# Patient Record
Sex: Female | Born: 1985 | Race: Black or African American | Hispanic: No | Marital: Single | Smoking: Never smoker
Health system: Southern US, Community
[De-identification: ages and names within clinical notes are randomized; demographics above are authoritative.]

## PROBLEM LIST (undated history)

## (undated) ENCOUNTER — Inpatient Hospital Stay: Payer: Self-pay

## (undated) DIAGNOSIS — E119 Type 2 diabetes mellitus without complications: Secondary | ICD-10-CM

## (undated) DIAGNOSIS — D649 Anemia, unspecified: Secondary | ICD-10-CM

---

## 2013-11-07 ENCOUNTER — Ambulatory Visit: Payer: Self-pay | Admitting: Family Medicine

## 2013-11-07 LAB — COMPREHENSIVE METABOLIC PANEL
ALT: 18 U/L
AST: 13 U/L — AB (ref 15–37)
Albumin: 3.5 g/dL (ref 3.4–5.0)
Alkaline Phosphatase: 55 U/L
Anion Gap: 8 (ref 7–16)
BUN: 7 mg/dL (ref 7–18)
Bilirubin,Total: 0.2 mg/dL (ref 0.2–1.0)
CALCIUM: 9.5 mg/dL (ref 8.5–10.1)
CHLORIDE: 103 mmol/L (ref 98–107)
CO2: 28 mmol/L (ref 21–32)
CREATININE: 0.97 mg/dL (ref 0.60–1.30)
EGFR (African American): >60
GLUCOSE: 99 mg/dL (ref 65–99)
OSMOLALITY: 276 (ref 275–301)
POTASSIUM: 3.9 mmol/L (ref 3.5–5.1)
Sodium: 139 mmol/L (ref 136–145)
Total Protein: 7.4 g/dL (ref 6.4–8.2)

## 2013-11-07 LAB — URINALYSIS, COMPLETE
BILIRUBIN, UR: NEGATIVE
Glucose,UR: NEGATIVE
Ketone: NEGATIVE
Nitrite: NEGATIVE
PH: 6 (ref 5.0–8.0)
Protein: NEGATIVE
SPECIFIC GRAVITY: 1.025 (ref 1.000–1.030)

## 2013-11-07 LAB — PREGNANCY, URINE: Pregnancy Test, Urine: POSITIVE m[IU]/mL

## 2013-11-11 LAB — URINE CULTURE

## 2013-11-30 ENCOUNTER — Ambulatory Visit: Payer: Self-pay

## 2013-11-30 LAB — URINALYSIS, COMPLETE
BILIRUBIN, UR: NEGATIVE
Blood: NEGATIVE
GLUCOSE, UR: NEGATIVE
Leukocyte Esterase: NEGATIVE
NITRITE: NEGATIVE
Ph: 5.5 (ref 5.0–8.0)
Specific Gravity: 1.02 (ref 1.000–1.030)

## 2013-12-02 LAB — URINE CULTURE

## 2014-05-01 ENCOUNTER — Ambulatory Visit
Admit: 2014-05-01 | Disposition: A | Discharge status: Home / Self Care | Payer: Self-pay | Attending: Nurse Practitioner | Admitting: Nurse Practitioner

## 2014-05-12 DIAGNOSIS — O24419 Gestational diabetes mellitus in pregnancy, unspecified control: Secondary | ICD-10-CM | POA: Insufficient documentation

## 2014-05-12 DIAGNOSIS — N12 Tubulo-interstitial nephritis, not specified as acute or chronic: Secondary | ICD-10-CM | POA: Insufficient documentation

## 2014-05-18 ENCOUNTER — Ambulatory Visit
Admit: 2014-05-18 | Disposition: A | Discharge status: Home / Self Care | Payer: Self-pay | Attending: Nurse Practitioner | Admitting: Nurse Practitioner

## 2014-06-03 ENCOUNTER — Observation Stay
Admit: 2014-06-03 | Disposition: A | Discharge status: Home / Self Care | Payer: Self-pay | Attending: Certified Nurse Midwife | Admitting: Certified Nurse Midwife

## 2014-06-03 LAB — URINALYSIS, COMPLETE
BILIRUBIN, UR: NEGATIVE
Bacteria: NONE SEEN
Blood: NEGATIVE
Glucose,UR: NEGATIVE mg/dL (ref 0–75)
KETONE: NEGATIVE
Leukocyte Esterase: NEGATIVE
Nitrite: NEGATIVE
Ph: 6 (ref 4.5–8.0)
Specific Gravity: 1.024 (ref 1.003–1.030)

## 2014-06-06 ENCOUNTER — Ambulatory Visit
Admit: 2014-06-06 | Disposition: A | Discharge status: Home / Self Care | Payer: Self-pay | Attending: Advanced Practice Midwife | Admitting: Advanced Practice Midwife

## 2014-06-26 ENCOUNTER — Encounter
Admission: EM | Disposition: A | Discharge status: Home / Self Care | Payer: Self-pay | Source: Ambulatory Visit | Attending: Obstetrics and Gynecology

## 2014-06-26 ENCOUNTER — Inpatient Hospital Stay: Payer: Medicaid Other | Admitting: Anesthesiology

## 2014-06-26 ENCOUNTER — Inpatient Hospital Stay
Admission: EM | Admit: 2014-06-26 | Discharge: 2014-06-28 | DRG: 765 | Disposition: A | Discharge status: Home / Self Care | Payer: Medicaid Other | Source: Ambulatory Visit | Attending: Obstetrics and Gynecology | Admitting: Obstetrics and Gynecology

## 2014-06-26 ENCOUNTER — Encounter: Payer: Self-pay | Admitting: *Deleted

## 2014-06-26 DIAGNOSIS — O24419 Gestational diabetes mellitus in pregnancy, unspecified control: Secondary | ICD-10-CM | POA: Diagnosis present

## 2014-06-26 DIAGNOSIS — O328XX0 Maternal care for other malpresentation of fetus, not applicable or unspecified: Principal | ICD-10-CM | POA: Diagnosis present

## 2014-06-26 DIAGNOSIS — D62 Acute posthemorrhagic anemia: Secondary | ICD-10-CM | POA: Diagnosis not present

## 2014-06-26 DIAGNOSIS — R234 Changes in skin texture: Secondary | ICD-10-CM

## 2014-06-26 DIAGNOSIS — O329XX0 Maternal care for malpresentation of fetus, unspecified, not applicable or unspecified: Secondary | ICD-10-CM | POA: Diagnosis present

## 2014-06-26 DIAGNOSIS — Z6835 Body mass index (BMI) 35.0-35.9, adult: Secondary | ICD-10-CM | POA: Diagnosis not present

## 2014-06-26 DIAGNOSIS — Z3A39 39 weeks gestation of pregnancy: Secondary | ICD-10-CM | POA: Diagnosis present

## 2014-06-26 DIAGNOSIS — O321XX0 Maternal care for breech presentation, not applicable or unspecified: Secondary | ICD-10-CM | POA: Diagnosis present

## 2014-06-26 HISTORY — DX: Type 2 diabetes mellitus without complications: E11.9

## 2014-06-26 HISTORY — DX: Anemia, unspecified: D64.9

## 2014-06-26 LAB — OB RESULTS CONSOLE ABO/RH: RH Type: POSITIVE

## 2014-06-26 LAB — TYPE AND SCREEN
ABO/RH(D): A POS
ANTIBODY SCREEN: NEGATIVE

## 2014-06-26 LAB — OB RESULTS CONSOLE HIV ANTIBODY (ROUTINE TESTING): HIV: NONREACTIVE

## 2014-06-26 LAB — HEMOGLOBIN AND HEMATOCRIT, BLOOD
HEMATOCRIT: 30.2 % — AB (ref 35.0–47.0)
Hemoglobin: 10 g/dL — ABNORMAL LOW (ref 12.0–16.0)

## 2014-06-26 LAB — ABO/RH: ABO/RH(D): A POS

## 2014-06-26 LAB — CBC
HEMATOCRIT: 32.2 % — AB (ref 35.0–47.0)
Hemoglobin: 10.3 g/dL — ABNORMAL LOW (ref 12.0–16.0)
MCH: 29 pg (ref 26.0–34.0)
MCHC: 32.1 g/dL (ref 32.0–36.0)
MCV: 90.2 fL (ref 80.0–100.0)
Platelets: 270 10*3/uL (ref 150–440)
RBC: 3.56 MIL/uL — AB (ref 3.80–5.20)
RDW: 15.1 % — ABNORMAL HIGH (ref 11.5–14.5)
WBC: 5.8 10*3/uL (ref 3.6–11.0)

## 2014-06-26 LAB — OB RESULTS CONSOLE ANTIBODY SCREEN: ANTIBODY SCREEN: NEGATIVE

## 2014-06-26 LAB — OB RESULTS CONSOLE GC/CHLAMYDIA
CHLAMYDIA, DNA PROBE: NEGATIVE
GC PROBE AMP, GENITAL: NEGATIVE

## 2014-06-26 LAB — OB RESULTS CONSOLE RUBELLA ANTIBODY, IGM: Rubella: IMMUNE

## 2014-06-26 LAB — OB RESULTS CONSOLE VARICELLA ZOSTER ANTIBODY, IGG: Varicella: IMMUNE

## 2014-06-26 LAB — OB RESULTS CONSOLE RPR: RPR: NONREACTIVE

## 2014-06-26 LAB — OB RESULTS CONSOLE GBS: STREP GROUP B AG: NEGATIVE

## 2014-06-26 LAB — OB RESULTS CONSOLE HEPATITIS B SURFACE ANTIGEN: HEP B S AG: NEGATIVE

## 2014-06-26 LAB — GLUCOSE, CAPILLARY: Glucose-Capillary: 98 mg/dL (ref 70–99)

## 2014-06-26 SURGERY — Surgical Case
Anesthesia: Spinal

## 2014-06-26 MED ORDER — SENNOSIDES-DOCUSATE SODIUM 8.6-50 MG PO TABS: 2.0000 | ORAL_TABLET | Freq: Every evening | ORAL | Status: DC | PRN

## 2014-06-26 MED ORDER — SIMETHICONE 80 MG PO CHEW
80.0000 mg | CHEWABLE_TABLET | ORAL | Status: DC
  Administered 2014-06-26 – 2014-06-28 (×3): 80 mg via ORAL
  Filled 2014-06-26 (×2): qty 1

## 2014-06-26 MED ORDER — CEFAZOLIN SODIUM-DEXTROSE 2-3 GM-% IV SOLR
2.0000 g | INTRAVENOUS | Status: AC
  Administered 2014-06-26: 2 g via INTRAVENOUS

## 2014-06-26 MED ORDER — LACTATED RINGERS IV SOLN
INTRAVENOUS | Status: DC
  Administered 2014-06-26: 09:00:00 via INTRAVENOUS

## 2014-06-26 MED ORDER — NALOXONE HCL 1 MG/ML IJ SOLN
1.0000 ug/kg/h | INTRAVENOUS | Status: DC | PRN
  Filled 2014-06-26: qty 2

## 2014-06-26 MED ORDER — HYDROMORPHONE HCL 1 MG/ML IJ SOLN: 0.2500 mg | INTRAMUSCULAR | Status: DC | PRN

## 2014-06-26 MED ORDER — SODIUM CHLORIDE 0.9 % IJ SOLN
INTRAMUSCULAR | Status: AC
  Filled 2014-06-26: qty 6

## 2014-06-26 MED ORDER — FENTANYL CITRATE (PF) 250 MCG/5ML IJ SOLN
INTRAMUSCULAR | Status: DC | PRN
  Administered 2014-06-26: 20 ug via INTRATHECAL

## 2014-06-26 MED ORDER — MEPERIDINE HCL 25 MG/ML IJ SOLN: 6.2500 mg | INTRAMUSCULAR | Status: DC | PRN

## 2014-06-26 MED ORDER — FENTANYL CITRATE (PF) 100 MCG/2ML IJ SOLN: 25.0000 ug | INTRAMUSCULAR | Status: DC | PRN

## 2014-06-26 MED ORDER — NALBUPHINE HCL 10 MG/ML IJ SOLN: 5.0000 mg | INTRAMUSCULAR | Status: DC | PRN

## 2014-06-26 MED ORDER — PHENYLEPHRINE HCL 10 MG/ML IJ SOLN
INTRAMUSCULAR | Status: DC | PRN
  Administered 2014-06-26 (×6): 100 ug via INTRAVENOUS

## 2014-06-26 MED ORDER — MENTHOL 3 MG MT LOZG
1.0000 | LOZENGE | OROMUCOSAL | Status: DC | PRN
  Filled 2014-06-26: qty 9

## 2014-06-26 MED ORDER — MORPHINE SULFATE (PF) 0.5 MG/ML IJ SOLN
INTRAMUSCULAR | Status: DC | PRN
  Administered 2014-06-26: .2 mg via INTRATHECAL

## 2014-06-26 MED ORDER — DIPHENHYDRAMINE HCL 25 MG PO CAPS: 25.0000 mg | ORAL_CAPSULE | Freq: Four times a day (QID) | ORAL | Status: DC | PRN

## 2014-06-26 MED ORDER — DIPHENHYDRAMINE HCL 50 MG/ML IJ SOLN: 12.5000 mg | INTRAMUSCULAR | Status: DC | PRN

## 2014-06-26 MED ORDER — SIMETHICONE 80 MG PO CHEW
80.0000 mg | CHEWABLE_TABLET | Freq: Three times a day (TID) | ORAL | Status: DC
  Administered 2014-06-27 – 2014-06-28 (×5): 80 mg via ORAL
  Filled 2014-06-26 (×4): qty 1

## 2014-06-26 MED ORDER — ONDANSETRON HCL 4 MG/2ML IJ SOLN
INTRAMUSCULAR | Status: DC | PRN
  Administered 2014-06-26: 4 mg via INTRAVENOUS

## 2014-06-26 MED ORDER — SODIUM CHLORIDE 0.9 % IJ SOLN: 3.0000 mL | INTRAMUSCULAR | Status: DC | PRN

## 2014-06-26 MED ORDER — SCOPOLAMINE 1 MG/3DAYS TD PT72
1.0000 | MEDICATED_PATCH | Freq: Once | TRANSDERMAL | Status: DC
  Filled 2014-06-26: qty 1

## 2014-06-26 MED ORDER — ONDANSETRON HCL 4 MG/2ML IJ SOLN
4.0000 mg | Freq: Three times a day (TID) | INTRAMUSCULAR | Status: DC | PRN
  Administered 2014-06-26: 4 mg via INTRAVENOUS
  Filled 2014-06-26: qty 2

## 2014-06-26 MED ORDER — OXYCODONE HCL 5 MG PO TABS
10.0000 mg | ORAL_TABLET | Freq: Four times a day (QID) | ORAL | Status: DC | PRN
  Administered 2014-06-26 – 2014-06-28 (×7): 10 mg via ORAL
  Filled 2014-06-26 (×8): qty 2

## 2014-06-26 MED ORDER — LANOLIN HYDROUS EX OINT: 1.0000 "application " | TOPICAL_OINTMENT | CUTANEOUS | Status: DC | PRN

## 2014-06-26 MED ORDER — LACTATED RINGERS IV BOLUS (SEPSIS)
1000.0000 mL | Freq: Once | INTRAVENOUS | Status: AC
  Administered 2014-06-26: 1000 mL via INTRAVENOUS

## 2014-06-26 MED ORDER — DIPHENHYDRAMINE HCL 25 MG PO CAPS: 25.0000 mg | ORAL_CAPSULE | ORAL | Status: DC | PRN

## 2014-06-26 MED ORDER — NALBUPHINE HCL 10 MG/ML IJ SOLN: 5.0000 mg | Freq: Once | INTRAMUSCULAR | Status: AC | PRN

## 2014-06-26 MED ORDER — TETANUS-DIPHTH-ACELL PERTUSSIS 5-2.5-18.5 LF-MCG/0.5 IM SUSP
0.5000 mL | Freq: Once | INTRAMUSCULAR | Status: DC
  Filled 2014-06-26: qty 0.5

## 2014-06-26 MED ORDER — WITCH HAZEL-GLYCERIN EX PADS: 1.0000 "application " | MEDICATED_PAD | CUTANEOUS | Status: DC | PRN

## 2014-06-26 MED ORDER — POTASSIUM CITRATE-CITRIC ACID 1100-334 MG/5ML PO SOLN
30.0000 meq | Freq: Once | ORAL | Status: AC
  Administered 2014-06-26: 30 meq via ORAL
  Filled 2014-06-26: qty 15

## 2014-06-26 MED ORDER — LACTATED RINGERS IV SOLN: INTRAVENOUS | Status: DC

## 2014-06-26 MED ORDER — ACETAMINOPHEN 325 MG PO TABS: 650.0000 mg | ORAL_TABLET | ORAL | Status: DC | PRN

## 2014-06-26 MED ORDER — IBUPROFEN 600 MG PO TABS
600.0000 mg | ORAL_TABLET | Freq: Four times a day (QID) | ORAL | Status: DC | PRN
  Administered 2014-06-27 – 2014-06-28 (×4): 600 mg via ORAL
  Filled 2014-06-26 (×4): qty 1

## 2014-06-26 MED ORDER — LACTATED RINGERS IV SOLN
INTRAVENOUS | Status: DC
  Administered 2014-06-27: 09:00:00 via INTRAVENOUS

## 2014-06-26 MED ORDER — OXYTOCIN 40 UNITS IN LACTATED RINGERS INFUSION - SIMPLE MED
INTRAVENOUS | Status: AC
  Administered 2014-06-26: 1000 mL via INTRAVENOUS
  Filled 2014-06-26: qty 1000

## 2014-06-26 MED ORDER — PRENATAL MULTIVITAMIN CH
1.0000 | ORAL_TABLET | Freq: Every day | ORAL | Status: DC
  Administered 2014-06-27: 1 via ORAL
  Filled 2014-06-26: qty 1

## 2014-06-26 MED ORDER — NALOXONE HCL 0.4 MG/ML IJ SOLN: 0.4000 mg | INTRAMUSCULAR | Status: DC | PRN

## 2014-06-26 MED ORDER — OXYTOCIN 40 UNITS IN LACTATED RINGERS INFUSION - SIMPLE MED
62.5000 mL/h | INTRAVENOUS | Status: AC
  Filled 2014-06-26: qty 1000

## 2014-06-26 MED ORDER — SIMETHICONE 80 MG PO CHEW
80.0000 mg | CHEWABLE_TABLET | ORAL | Status: DC | PRN
Start: 2014-06-26 — End: 2014-06-29
  Filled 2014-06-26: qty 1

## 2014-06-26 MED ORDER — ONDANSETRON HCL 4 MG/2ML IJ SOLN: 4.0000 mg | Freq: Once | INTRAMUSCULAR | Status: DC | PRN

## 2014-06-26 MED ORDER — OXYCODONE HCL 5 MG PO TABS
5.0000 mg | ORAL_TABLET | Freq: Four times a day (QID) | ORAL | Status: DC | PRN
  Administered 2014-06-26 – 2014-06-27 (×2): 5 mg via ORAL
  Filled 2014-06-26: qty 1

## 2014-06-26 MED ORDER — SIMETHICONE 80 MG PO CHEW
80.0000 mg | CHEWABLE_TABLET | ORAL | Status: DC
  Filled 2014-06-26: qty 1

## 2014-06-26 MED ORDER — DIBUCAINE 1 % RE OINT: 1.0000 "application " | TOPICAL_OINTMENT | RECTAL | Status: DC | PRN

## 2014-06-26 SURGICAL SUPPLY — 31 items
CANISTER SUCT 3000ML (MISCELLANEOUS) ×3 IMPLANT
CHLORAPREP W/TINT 26ML (MISCELLANEOUS) ×3 IMPLANT
CLOSURE WOUND 1/2 X4 (GAUZE/BANDAGES/DRESSINGS) ×1
DRSG TELFA 3X8 NADH (GAUZE/BANDAGES/DRESSINGS) ×3 IMPLANT
GAUZE SPONGE 4X4 12PLY STRL (GAUZE/BANDAGES/DRESSINGS) ×3 IMPLANT
GLOVE BIO SURGEON STRL SZ7 (GLOVE) ×12 IMPLANT
GLOVE INDICATOR 7.5 STRL GRN (GLOVE) ×12 IMPLANT
GOWN STRL REUS W/ TWL LRG LVL3 (GOWN DISPOSABLE) ×3 IMPLANT
GOWN STRL REUS W/ TWL XL LVL3 (GOWN DISPOSABLE) ×2 IMPLANT
GOWN STRL REUS W/TWL LRG LVL3 (GOWN DISPOSABLE) ×6
GOWN STRL REUS W/TWL XL LVL3 (GOWN DISPOSABLE) ×4
LIQUID BAND (GAUZE/BANDAGES/DRESSINGS) ×3 IMPLANT
NS IRRIG 1000ML POUR BTL (IV SOLUTION) ×3 IMPLANT
PACK C SECTION AR (MISCELLANEOUS) ×3 IMPLANT
PAD GROUND ADULT SPLIT (MISCELLANEOUS) ×3 IMPLANT
PAD OB MATERNITY 4.3X12.25 (PERSONAL CARE ITEMS) ×3 IMPLANT
PAD PREP 24X41 OB/GYN DISP (PERSONAL CARE ITEMS) ×3 IMPLANT
SPONGE LAP 18X18 5 PK (GAUZE/BANDAGES/DRESSINGS) ×6 IMPLANT
STRIP CLOSURE SKIN 1/2X4 (GAUZE/BANDAGES/DRESSINGS) ×2 IMPLANT
SUT MNCRL 3 0 RB1 (SUTURE) ×3 IMPLANT
SUT MNCRL AB 4-0 PS2 18 (SUTURE) ×3 IMPLANT
SUT MONOCRYL 3 0 RB1 (SUTURE) ×6
SUT PDS AB 1 TP1 96 (SUTURE) ×3 IMPLANT
SUT PLAIN 2 0 XLH (SUTURE) ×3 IMPLANT
SUT VIC AB 0 CT1 36 (SUTURE) ×6 IMPLANT
SUT VIC AB 0 CTX 36 (SUTURE) ×4
SUT VIC AB 0 CTX36XBRD ANBCTRL (SUTURE) ×2 IMPLANT
SUT VIC AB 2-0 CT1 36 (SUTURE) ×3 IMPLANT
SUT VIC AB 3-0 SH 27 (SUTURE) ×2
SUT VIC AB 3-0 SH 27X BRD (SUTURE) ×1 IMPLANT
SYRINGE 10CC LL (SYRINGE) ×3 IMPLANT

## 2014-06-26 NOTE — H&P (Signed)
Obstetric History and Physical  Robin Davenport is a Myer [id number] y.o. G1P0 with IUP at 2025w4d presenting for regular contractions since this am. Patient states she has been having minimal vaginal bleeding, intact membranes, with active fetal movement.    Prenatal Course Source of Care: [facility]  with onset of care at 14 weeks Pregnancy complications or risks: Patient Active Problem List   Diagnosis Date Noted  . Diabetes mellitus arising in pregnancy 05/12/2014  . 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   Prenatal labs and studies: ABO, Rh: A+ Antibody: neg Rubella: Immune Varicella: Immune RPR:  Non-reactive HBsAg:  Neg HIV: Non-reactive GC/CT: neg/neg GBS: neg 1 hr Glucola  140, 3 hr: 2 abnormal values Genetic screening: AFP tetra negative   Prenatal Transfer Tool   No past medical history on file.  No past surgical history on file.  OB Hx: G5 P4004, SVD x 4  Social History: no tobacco, ETOH or drug use  No family history on file.  Medications: PNV, Keflex? Macrobid?    Allergies: none  Review of Systems: Negative except for what is mentioned in HPI.  Physical Exam: There were no vitals taken for this visit. GENERAL: Well-developed, well-nourished female in no acute distress.  LUNGS: Clear to auscultation bilaterally.  HEART: Regular rate and rhythm. ABDOMEN: Soft, nontender, nondistended, gravid. EXTREMITIES: Nontender, no edema Cervical Exam: Dilatation 4cm    Presentation: breech per bedside ultrasound FHT: Category: Baseline rate 140  bpm   Variability moderate  Accelerations absent   Decelerations none Contractions: Every 5 mins   Pertinent Labs/Studies:   No results found for this or any previous visit (from the past 24 hour(s)).  Assessment : IUP at 2725w4d, labor, breech   Plan: Admission for urgent C-section.   Dr Vergie LivingPickens notified Reviewed with pt common risks of c-section including infection, bleeding and damage to surrounding organs as well as risks of anesthesia.

## 2014-06-26 NOTE — Treatment Plan (Signed)
[facility]

## 2014-06-26 NOTE — OR Nursing (Signed)
Placenta placed in biohazard bag and labeled with pt sticker, placed into placenta fridge for 7 days.

## 2014-06-26 NOTE — Transfer of Care (Signed)
Immediate Anesthesia Transfer of Care Note  Patient: Robin Davenport  Procedure(s) Performed: Procedure(s): CESAREAN SECTION  Patient Location: PACU  Anesthesia Type:Spinal  Level of Consciousness: awake, alert  and oriented  Airway & Oxygen Therapy: Patient Spontanous Breathing  Post-op Assessment: Report given to RN and Post -op Vital signs reviewed and stable  Post vital signs: stable  Last Vitals:  Filed Vitals:   06/26/14 1043  BP: 117/55  Pulse: 59  Temp: 36.7 C    Complications: No apparent anesthesia complications

## 2014-06-26 NOTE — Anesthesia Preprocedure Evaluation (Signed)
Anesthesia Evaluation  Patient identified by MRN, date of birth, ID band Patient awake    Reviewed: Allergy & Precautions, H&P , NPO status , Patient's Chart, lab work & pertinent test results, reviewed documented beta blocker date and time   Airway Mallampati: II  TM Distance: >3 FB Neck ROM: full    Dental no notable dental hx.    Pulmonary neg pulmonary ROS,  breath sounds clear to auscultation  Pulmonary exam normal       Cardiovascular Exercise Tolerance: Good negative cardio ROS  Rhythm:regular Rate:Normal     Neuro/Psych negative neurological ROS  negative psych ROS   GI/Hepatic negative GI ROS, Neg liver ROS,   Endo/Other  negative endocrine ROSdiabetes, Well Controlled  Renal/GU Renal diseasenegative Renal ROS  negative genitourinary   Musculoskeletal   Abdominal   Peds  Hematology negative hematology ROS (+)   Anesthesia Other Findings   Reproductive/Obstetrics (+) Pregnancy                             Anesthesia Physical Anesthesia Plan  ASA: II  Anesthesia Plan: Spinal   Post-op Pain Management:    Induction:   Airway Management Planned:   Additional Equipment:   Intra-op Plan:   Post-operative Plan:   Informed Consent: I have reviewed the patients History and Physical, chart, labs and discussed the procedure including the risks, benefits and alternatives for the proposed anesthesia with the patient or authorized representative who has indicated his/her understanding and acceptance.     Plan Discussed with:   Anesthesia Plan Comments:         Anesthesia Quick Evaluation

## 2014-06-26 NOTE — Anesthesia Procedure Notes (Signed)
Spinal Patient location during procedure: OR Start time: 06/26/2014 9:20 AM Staffing Performed by: anesthesiologist  Preanesthetic Checklist Completed: patient identified, site marked, surgical consent, pre-op evaluation, timeout performed, IV checked, risks and benefits discussed and monitors and equipment checked Spinal Block Patient position: sitting Prep: Betadine Patient monitoring: heart rate, continuous pulse ox, blood pressure and cardiac monitor Approach: midline Location: L4-5 Injection technique: single-shot Needle Needle type: Whitacre and Introducer  Needle gauge: 24 G Needle length: 9 cm Assessment Sensory level: T4 Additional Notes Negative paresthesia. Negative blood return. Positive free-flowing CSF. Expiration date of kit checked and confirmed. Patient tolerated procedure well, without complications.

## 2014-06-26 NOTE — Progress Notes (Addendum)
OB Note 39/5, multip at 4cm and breech with category I tracing and q4263m UCs. Last PO before MN. Will proceed with urgent c-section when all parties ready. Preg c/b BMI 35, GDMA1, poor compliance with A1c early April 5.7 and last visit 7 days ago and at 36wks; pt noted to be vertex last week. BS in EMS 96

## 2014-06-26 NOTE — Progress Notes (Signed)
Patient asking for "something to eat." Discuss clear liquid diet. Would like to try apple juice. Bed bath, gown change, pericare and prepared to go to postpartum unit. Ermalene SearingK. Scotty Weigelt, RN

## 2014-06-26 NOTE — Op Note (Addendum)
Operative Note   SURGERY DATE: 06/26/2014  PRE-OP DIAGNOSIS:  *Intrauterine pregnancy @ 39/4 *Breech *Active labor *GDMA1 *BMI 35  POST-OP DIAGNOSIS: Same. Moderate meconium. Homero FellersFrank breech   PROCEDURE: primary low transverse cesarean section via pfannenstiel skin incision with double layer uterine closure  SURGEON: Surgeon(s) and Role:    * Groveton Bingharlie Terriyah Westra, MD - Primary  ASSISTANT:    Vena Austria* Andreas Staebler, MD - Assisting  ANESTHESIA: Spinal  ESTIMATED BLOOD LOSS: 700mL  DRAINS: Indwelling foley (50mL)   TOTAL IV FLUIDS: 1500mL crystalloid  VTE Prophylaxis: SCDs  Antibiotics: Ancef 2gm given pre operatively   SPECIMENS: none  COMPLICATIONS: None  FINDINGS: No intra-abdominal adhesions were noted. Grossly normal uterus, tubes and ovaries, with filmy adhesions seen on the bilateral adnexa. Moderately stained amniotic fluid, frank breech female infant, weight 6lbs 14oz (3118gm), APGARs 9/9, intact placenta with 3 vessel cord.  PROCEDURE IN DETAIL: The patient was taken to the operating room where anesthesia was administered and normal fetal heart tones were confirmed. She was then prepped and draped in the normal fashion in the dorsal supine position with a leftward tilt.  After a time out was performed, a pfannensteil  skin incision was made with the scalpel and carried through to the underlying layer of fascia. The fascia was then incised at the midline and this incision was extended laterally with the mayo scissors. Attention was turned to the superior aspect of the fascial incision which was grasped with the kocher clamps x 2, tented up and the rectus muscles were dissected off with the mayo scissors. In a similar fashion the inferior aspect of the fascial incision was grasped with the kocher clamps, tented up and the rectus muscles dissected off with the mayo scissors. The rectus muscles were then separated in the midline and the peritoneum was entered bluntly. The bladder blade  was inserted and the vesicouterine peritoneum was identified, tented up and entered with the metzenbaum scissors. This incision was extended laterally and the bladder flap was created digitally. The bladder blade was reinserted.  A low transverse hysterotomy was made with the scalpel until the endometrial cavity was breached yielding moderately stained meconium amniotic fluid. This incision was extended bluntly and the infant's head, shoulders and body were delivered atraumatically.The cord was clamped x 2 and cut, and the infant was handed to the awaiting pediatricians.  The placenta was then gradually expressed from the uterus and then the uterus was exteriorized and cleared of all clots and debris. The hysterotomy was repaired with a running suture of 1-0 monocryl. A second imbricating layer of 1-0 monocryl suture was then placed. Several figure-of-eight sutures of 1-0 monocryl were added to achieve excellent hemostasis.    The uterus and adnexa were then returned to the abdomen, and the hysterotomy and all operative sites, including inspection of the muscules, were reinspected and excellent hemostasis was noted after irrigation and suction of the abdomen with warm saline.  Using two Kelly clamps, the peritoneum was closed with a running stitch of 3-0 polysorb. The fascia was reapproximated with 0 polysorb in a simple running fashion. The subcutaneous layer was then reapproximated with interrupted sutures of 2-0 plain gut, and the skin was then closed with 4-0 monocryl, in a subcuticular fashion.  The patient  tolerated the procedure well. Sponge, lap, needle, and instrument counts were correct x 2. The patient was transferred to the recovery room awake, alert and breathing independently in stable condition.  Robin Davenport, Jr. MD [facility] Pager [phone]

## 2014-06-27 LAB — RPR: RPR: NONREACTIVE

## 2014-06-27 LAB — HEMATOCRIT: HCT: 28.3 % — ABNORMAL LOW (ref 35.0–47.0)

## 2014-06-27 MED ORDER — FERROUS SULFATE 325 (65 FE) MG PO TABS
325.0000 mg | ORAL_TABLET | Freq: Every day | ORAL | Status: DC
  Administered 2014-06-28: 325 mg via ORAL
  Filled 2014-06-27 (×2): qty 1

## 2014-06-27 MED ORDER — DOCUSATE SODIUM 100 MG PO CAPS
100.0000 mg | ORAL_CAPSULE | Freq: Every day | ORAL | Status: DC
  Administered 2014-06-27 – 2014-06-28 (×2): 100 mg via ORAL
  Filled 2014-06-27 (×2): qty 1

## 2014-06-27 MED ORDER — PRENATAL MULTIVITAMIN CH
1.0000 | ORAL_TABLET | Freq: Every day | ORAL | Status: DC
Start: 2014-06-28 — End: 2014-06-29
  Administered 2014-06-28: 1 via ORAL
  Filled 2014-06-27: qty 1

## 2014-06-27 NOTE — Progress Notes (Signed)
POD #1 Primary low transverse C-section for breech presentation. GDM with this pregnancy   S: Feeling well. Tolerating regular diet. Bottlefeeding Baby is [employer] Nurses reported low UO earlier this AM (775ml) over 21 hours UO has increased with po fluid intake Is on contact precautions because of a urine culture in Sept that was positive for Acinebacter  O: afebrile 111/61 1300 ml urine output via foley since 0600 this AM. Hct=28.3% this AM Heart: RRR without murmur Lungs: CTA Abd: soft but distended. BS active Dressing C+D+I No calf tenderness  A: Stable POD #1  P: DC IV, DC foley DC pulse ox Ambulate with assist Trial of voiding FSBS in AM

## 2014-06-27 NOTE — Anesthesia Postprocedure Evaluation (Signed)
  Anesthesia Post-op Note  Patient: Robin Davenport  Procedure(s) Performed: Procedure(s): CESAREAN SECTION  Anesthesia type:Spinal  Patient location: 349  Post pain: Pain level controlled  Post assessment: Post-op Vital signs reviewed, Patient's Cardiovascular Status Stable, Respiratory Function Stable, Patent Airway and No signs of Nausea or vomiting  Post vital signs: Reviewed and stable  Last Vitals:  Filed Vitals:   06/27/14 0417  BP: 103/59  Pulse: 82  Temp: 37.3 C  Resp: 18    Level of consciousness: awake, alert  and patient cooperative  Complications: No apparent anesthesia complications

## 2014-06-27 NOTE — Anesthesia Post-op Follow-up Note (Signed)
  Anesthesia Pain Follow-up Note  Patient: Robin Davenport  Day #: 1  Date of Follow-up: 06/27/2014 Time: 7:37 AM  Last Vitals:  Filed Vitals:   06/27/14 0417  BP: 103/59  Pulse: 82  Temp: 37.3 C  Resp: 18    Level of Consciousness: alert  Pain: mild   Side Effects:None  Catheter Site Exam:clean, dry, no drainage  Plan: D/C from anesthesia care  Zachary GeorgeWeatherly,  Rocio Roam F

## 2014-06-28 ENCOUNTER — Inpatient Hospital Stay: Payer: Medicaid Other

## 2014-06-28 LAB — GLUCOSE, CAPILLARY: Glucose-Capillary: 120 mg/dL — ABNORMAL HIGH (ref 65–99)

## 2014-06-28 MED ORDER — IBUPROFEN 600 MG PO TABS: 600.0000 mg | ORAL_TABLET | Freq: Four times a day (QID) | ORAL | Status: DC | PRN

## 2014-06-28 MED ORDER — SULFAMETHOXAZOLE-TRIMETHOPRIM 800-160 MG PO TABS
1.0000 | ORAL_TABLET | Freq: Two times a day (BID) | ORAL | Status: DC
  Administered 2014-06-28 (×2): 1 via ORAL
  Filled 2014-06-28 (×3): qty 1

## 2014-06-28 MED ORDER — OXYCODONE HCL 5 MG PO TABS: 5.0000 mg | ORAL_TABLET | Freq: Four times a day (QID) | ORAL | Status: DC | PRN

## 2014-06-28 MED ORDER — SULFAMETHOXAZOLE-TRIMETHOPRIM 800-160 MG PO TABS: 1.0000 | ORAL_TABLET | Freq: Two times a day (BID) | ORAL | Status: DC

## 2014-06-28 MED ORDER — ACETAMINOPHEN 325 MG PO TABS: 650.0000 mg | ORAL_TABLET | ORAL | Status: DC | PRN

## 2014-06-28 MED ORDER — DOCUSATE SODIUM 100 MG PO CAPS: 100.0000 mg | ORAL_CAPSULE | Freq: Every day | ORAL | Status: DC

## 2014-06-28 NOTE — Discharge Instructions (Signed)

## 2014-06-28 NOTE — Progress Notes (Signed)
Subjective:  Doing well, ambulating, tolerating regular PO diet, voiding spontaneously, tolerating pain with PO meds.  NO CP, SOB, Fever/chills, nausea/vomiting, or calf pain.  Complains of right upper extremity pain/tenderness and a [facility] that was removed yesterday. She says there was some pus coming out of the site as well.    Objective:  Blood pressure 108/49, pulse 73, temperature 98.1 F (36.7 C), temperature source Oral, resp. rate 18, height 5\' 7"  (1.702 m), weight 103.42 kg (228 lb), SpO2 99 %, unknown if currently breastfeeding.  General: NAD Pulmonary: no increased work of breathing Abdomen: non-distended, non-tender, fundus firm below level of umbilicus Incision: clean/dry/intact, steris. Lower Extremities: no edema, no erythema, no tenderness Upper right arm: not able to express any fluid/pus. 1cm knot at site of previous IV, mobile, induration and erythema surrounding this area, and warmth to the touch.  Spreading proximally to upper arm.  Results for orders placed or performed during the hospital encounter of 06/26/14 (from the past 72 hour(s))  Type and screen     Status: None   Collection Time: 06/26/14  8:42 AM  Result Value Ref Range   ABO/RH(D) A POS    Antibody Screen NEG    Sample Expiration 06/29/2014   CBC     Status: Abnormal   Collection Time: 06/26/14  8:43 AM  Result Value Ref Range   WBC 5.8 3.6 - 11.0 K/uL   RBC 3.56 (L) 3.80 - 5.20 MIL/uL   Hemoglobin 10.3 (L) 12.0 - 16.0 g/dL   HCT 91.432.2 (L) 78.235.0 - 95.647.0 %   MCV 90.2 80.0 - 100.0 fL   MCH 29.0 26.0 - 34.0 pg   MCHC 32.1 32.0 - 36.0 g/dL   RDW 21.315.1 (H) 08.611.5 - 57.814.5 %   Platelets 270 150 - 440 K/uL  RPR     Status: None   Collection Time: 06/26/14  8:43 AM  Result Value Ref Range   RPR Ser Ql Non Reactive Non Reactive    Comment: (NOTE) Performed At: [facility] [address] [place], [place] [id number] Mila HomerHancock William F MD UX:[id number]:[phone]   ABO/Rh     Status: None    Collection Time: 06/26/14  8:43 AM  Result Value Ref Range   ABO/RH(D) A POS   OB RESULTS CONSOLE ABO/Rh     Status: None   Collection Time: 06/26/14  8:56 AM  Result Value Ref Range   RH Type  Positive    ABO Grouping A   OB RESULTS CONSOLE GC/Chlamydia     Status: None   Collection Time: 06/26/14  8:57 AM  Result Value Ref Range   Gonorrhea Negative    Chlamydia Negative   OB RESULTS CONSOLE HIV antibody     Status: None   Collection Time: 06/26/14  8:57 AM  Result Value Ref Range   HIV Non-reactive   OB RESULT CONSOLE Group B Strep     Status: None   Collection Time: 06/26/14  8:58 AM  Result Value Ref Range   GBS Negative   OB RESULTS CONSOLE RPR     Status: None   Collection Time: 06/26/14  8:58 AM  Result Value Ref Range   RPR Nonreactive   OB RESULTS CONSOLE Rubella Antibody     Status: None   Collection Time: 06/26/14  8:58 AM  Result Value Ref Range   Rubella Immune   OB RESULTS CONSOLE Varicella zoster antibody, IgG     Status:  None   Collection Time: 06/26/14  8:58 AM  Result Value Ref Range   Varicella Immune   OB RESULTS CONSOLE Hepatitis B surface antigen     Status: None   Collection Time: 06/26/14  8:58 AM  Result Value Ref Range   Hepatitis B Surface Ag Negative   OB RESULTS CONSOLE Antibody Screen     Status: None   Collection Time: 06/26/14  8:59 AM  Result Value Ref Range   Antibody Screen Negative   Glucose, capillary     Status: None   Collection Time: 06/26/14 12:04 PM  Result Value Ref Range   Glucose-Capillary 98 70 - 99 mg/dL  Hemoglobin and hematocrit, blood     Status: Abnormal   Collection Time: 06/26/14  7:10 PM  Result Value Ref Range   Hemoglobin 10.0 (L) 12.0 - 16.0 g/dL   HCT 84.630.2 (L) 96.235.0 - 95.247.0 %  Hematocrit     Status: Abnormal   Collection Time: 06/27/14  7:26 AM  Result Value Ref Range   HCT 28.3 (L) 35.0 - 47.0 %  Glucose, capillary     Status: Abnormal   Collection Time: 06/28/14  6:21 AM  Result Value Ref Range    Glucose-Capillary 120 (H) 65 - 99 mg/dL     Assessment:   29 y.o. [id number] post operative day # 2 for breech   Plan:  1) Acute blood loss anemia - hemodynamically stable and asymptomatic - po ferrous sulfate  2) A/Positive/-- (05/10 [id number]) / Rubella Immune (05/10 [id number]) / Varicella Immune  3) TDAP given antepartum  4) Bottle feeding.  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

## 2014-06-28 NOTE — Progress Notes (Signed)
Patient discharged to home via wheelchair by nursing staff with infant and father of infant. Discharge instructions reviewed at this time with patient and significant other. Ezekiel InaMelissa D Tyrika Newman, RN

## 2014-06-28 NOTE — Discharge Summary (Signed)
Obstetric Discharge Summary Reason for Admission: cesarean section Prenatal Procedures: ultrasound Intrapartum Procedures: cesarean: low cervical, transverse Postpartum Procedures: ultrasound to upper extremity Complications-Operative and Postpartum: phlebitis HEMOGLOBIN  Date Value Ref Range Status  06/26/2014 10.0* 12.0 - 16.0 g/dL Final   HCT  Date Value Ref Range Status  06/27/2014 28.3* 35.0 - 47.0 % Final    Physical Exam:  General: alert and no distress Lochia: appropriate Uterine Fundus: firm Incision: healing well DVT Evaluation: LE without findings.  RUE with erythema, warmth, tenderness, and induration.  Discharge Diagnoses: Term Pregnancy-delivered  Discharge Information: Date: 06/28/2014 Activity: unrestricted Diet: routine Medications: PNV, Ibuprofen, Colace and Percocet Bactrim Condition: stable Instructions: refer to practice specific booklet and as noted in discharge instructions sheet Discharge to: home Follow-up Information    Follow up with [place] BingPickens,  Charlie, MD. Schedule an appointment as soon as possible for a visit in 1 week.   Specialty:  Obstetrics and Gynecology   Why:  For wound re-check   Contact information:   [address] [place] [address] [phone]       Newborn Data: Live born female  Birth Weight: 6 lb 14 oz (3118 g) APGAR: 9, 9  Home with mother.  Ward, Chelsea C 06/28/2014, 5:51 PM

## 2014-07-30 ENCOUNTER — Encounter: Payer: Self-pay | Admitting: Obstetrics and Gynecology

## 2015-04-30 ENCOUNTER — Emergency Department
Admission: EM | Admit: 2015-04-30 | Discharge: 2015-04-30 | Disposition: A | Discharge status: Home / Self Care | Payer: Medicaid Other | Attending: Emergency Medicine | Admitting: Emergency Medicine

## 2015-04-30 ENCOUNTER — Encounter: Payer: Self-pay | Admitting: Emergency Medicine

## 2015-04-30 DIAGNOSIS — Z79899 Other long term (current) drug therapy: Secondary | ICD-10-CM | POA: Insufficient documentation

## 2015-04-30 DIAGNOSIS — J029 Acute pharyngitis, unspecified: Secondary | ICD-10-CM | POA: Insufficient documentation

## 2015-04-30 DIAGNOSIS — E119 Type 2 diabetes mellitus without complications: Secondary | ICD-10-CM | POA: Insufficient documentation

## 2015-04-30 MED ORDER — MAGIC MOUTHWASH W/LIDOCAINE: 5.0000 mL | Freq: Four times a day (QID) | ORAL | Status: DC

## 2015-04-30 NOTE — Discharge Instructions (Signed)

## 2015-04-30 NOTE — ED Notes (Signed)
Sore throat for about 3 days.

## 2015-04-30 NOTE — ED Provider Notes (Signed)
[facility] Emergency Department Provider Note  ____________________________________________  Time seen: Approximately 7:09 PM  I have reviewed the triage vital signs and the nursing notes.   HISTORY  Chief Complaint Sore Throat    HPI Robin Davenport is a 30 y.o. female who presents emergency department complaining of a sore throat 3 days. Patient denies any fevers or chills, difficulty breathing or swallowing, nasal congestion, dull pain, nausea vomiting. Patient does endorse a dry cough. She has not taken any medications prior to arrival.   Past Medical History  Diagnosis Date  . Anemia   . Diabetes mellitus without complication [facility])     Patient Active Problem List   Diagnosis Date Noted  . Delivered by cesarean section 06/26/2014  . Diabetes mellitus arising in pregnancy 05/12/2014  . 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 Cesarean section  06/26/2014    Procedure: CESAREAN SECTION;  Surgeon: [place] Bing, MD;  Location: [facility] ORS;  Service: Obstetrics;;    Current Outpatient Rx  Name  Route  Sig  Dispense  Refill  . acetaminophen (TYLENOL) 325 MG tablet   Oral   Take 2 tablets (650 mg total) by mouth every 4 (four) hours as needed (for pain scale < 4).   120 tablet   1   . docusate sodium (COLACE) 100 MG capsule   Oral   Take 1 capsule (100 mg total) by mouth daily.   60 capsule   1   . ibuprofen (ADVIL,MOTRIN) 600 MG tablet   Oral   Take 1 tablet (600 mg total) by mouth every 6 (six) hours as needed for mild pain.   120 tablet   1   . magic mouthwash w/lidocaine SOLN   Oral   Take 5 mLs by mouth 4 (four) times daily.   240 mL   0     Dispense in a 1/1/1/1 ratio. Use lidocaine, diphen ...   . oxyCODONE (OXY IR/ROXICODONE) 5 MG immediate release tablet   Oral   Take 1 tablet (5 mg total) by mouth every 6 (six) hours as needed for severe pain.   30 tablet   0   . Prenatal  Vit-Fe Fumarate-FA (PRENATAL MULTIVITAMIN) TABS tablet   Oral   Take 1 tablet by mouth daily.         Marland Kitchen sulfamethoxazole-trimethoprim (BACTRIM DS,SEPTRA DS) 800-160 MG per tablet   Oral   Take 1 tablet by mouth every 12 (twelve) hours.   13 tablet   0     Allergies Review of patient's allergies indicates no known allergies.  No family history on file.  Social History Social History  Substance Use Topics  . Smoking status: Never Smoker   . Smokeless tobacco: Never Used  . Alcohol Use: None     Review of Systems  Constitutional: No fever/chills Eyes: No visual changes.  ENT: Positive sore throat. Denies nasal congestion. Denies ear fullness. Cardiovascular: no chest pain. Respiratory: Positive for dry cough. No SOB. Gastrointestinal: No abdominal pain.  No nausea, no vomiting.  Skin: Negative for rash. Neurological: Negative for headaches, focal weakness or numbness. 10-point ROS otherwise negative.  ____________________________________________   PHYSICAL EXAM:  VITAL SIGNS: ED Triage Vitals  Enc Vitals Group     BP 04/30/15 1818 141/84 mmHg     Pulse Rate 04/30/15 1818 95     Resp 04/30/15 1818 18     Temp 04/30/15 1818 98.7 F (37.1 C)  Temp Source 04/30/15 1818 Oral     SpO2 04/30/15 1818 100 %     Weight 04/30/15 1818 180 lb (81.647 kg)     Height 04/30/15 1818 5\' 7"  (1.702 m)     Head Cir --      Peak Flow --      Pain Score --      Pain Loc --      Pain Edu? --      Excl. in GC? --      Constitutional: Alert and oriented. Well appearing and in no acute distress. Eyes: Conjunctivae are normal. PERRL. EOMI. Head: Atraumatic. ENT:      Ears: EACs and TMs are unremarkable bilaterally.      Nose: No congestion/rhinnorhea.      Mouth/Throat: Mucous membranes are moist. Oropharynx is mildly erythematous but nonedematous. Uvula is midline. Tonsils are non-erythematous and not edematous. No exudates are identified. Neck: No stridor.    Hematological/Lymphatic/Immunilogical: No cervical lymphadenopathy. Cardiovascular: Normal rate, regular rhythm. Normal S1 and S2.  Good peripheral circulation. Respiratory: Normal respiratory effort without tachypnea or retractions. Lungs CTAB. Neurologic:  Normal speech and language. No gross focal neurologic deficits are appreciated.  Skin:  Skin is warm, dry and intact. No rash noted. Psychiatric: Mood and affect are normal. Speech and behavior are normal. Patient exhibits appropriate insight and judgement.   ____________________________________________   LABS (all labs ordered are listed, but only abnormal results are displayed)  Labs Reviewed - No data to display ____________________________________________  EKG   ____________________________________________  RADIOLOGY   No results found.  ____________________________________________    PROCEDURES  Procedure(s) performed:       Medications - No data to display   ____________________________________________   INITIAL IMPRESSION / ASSESSMENT AND PLAN / ED COURSE  Pertinent labs & imaging results that were available during my care of the patient were reviewed by me and considered in my medical decision making (see chart for details).  Patient's diagnosis is consistent with viral pharyngitis. Patient has a negative rapid strep test here in the emergency department and is negative on the Cent[facility] criteria for strep. Patient does not have lethargy or fatigue and at this time mononucleosis is not tested for. Patient will be discharged home with prescriptions for Magic mouthwash for symptom control. She state Tylenol and Motrin for additional symptom control. Patient is to follow up with primary care provider if symptoms persist past this treatment course. Patient is given ED precautions to return to the ED for any worsening or new symptoms.     ____________________________________________  FINAL CLINICAL  IMPRESSION(S) / ED DIAGNOSES  Final diagnoses:  Viral pharyngitis      NEW MEDICATIONS STARTED DURING THIS VISIT:  New Prescriptions   MAGIC MOUTHWASH W/LIDOCAINE SOLN    Take 5 mLs by mouth 4 (four) times daily.        This chart was dictated using voice recognition software/Dragon. Despite best efforts to proofread, errors can occur which can change the meaning. Any change was purely unintentional.   Racheal PatchesJonathan D Cuthriell, PA-C 04/30/15 1918  Governor Rooksebecca Lord, MD 04/30/15 2300

## 2015-05-01 LAB — POCT RAPID STREP A: STREPTOCOCCUS, GROUP A SCREEN (DIRECT): NEGATIVE

## 2015-05-03 LAB — CULTURE, GROUP A STREP (THRC)

## 2015-06-03 ENCOUNTER — Emergency Department
Admission: EM | Admit: 2015-06-03 | Discharge: 2015-06-03 | Disposition: A | Discharge status: Home / Self Care | Payer: Medicaid Other | Attending: Emergency Medicine | Admitting: Emergency Medicine

## 2015-06-03 ENCOUNTER — Encounter: Payer: Self-pay | Admitting: Emergency Medicine

## 2015-06-03 ENCOUNTER — Emergency Department: Payer: Medicaid Other

## 2015-06-03 DIAGNOSIS — O26891 Other specified pregnancy related conditions, first trimester: Secondary | ICD-10-CM | POA: Insufficient documentation

## 2015-06-03 DIAGNOSIS — Z79899 Other long term (current) drug therapy: Secondary | ICD-10-CM | POA: Insufficient documentation

## 2015-06-03 DIAGNOSIS — E119 Type 2 diabetes mellitus without complications: Secondary | ICD-10-CM | POA: Diagnosis not present

## 2015-06-03 DIAGNOSIS — Z3201 Encounter for pregnancy test, result positive: Secondary | ICD-10-CM | POA: Diagnosis not present

## 2015-06-03 DIAGNOSIS — Z3A14 14 weeks gestation of pregnancy: Secondary | ICD-10-CM | POA: Insufficient documentation

## 2015-06-03 DIAGNOSIS — Z3491 Encounter for supervision of normal pregnancy, unspecified, first trimester: Secondary | ICD-10-CM

## 2015-06-03 DIAGNOSIS — R1032 Left lower quadrant pain: Secondary | ICD-10-CM | POA: Diagnosis present

## 2015-06-03 DIAGNOSIS — Z792 Long term (current) use of antibiotics: Secondary | ICD-10-CM | POA: Insufficient documentation

## 2015-06-03 LAB — COMPREHENSIVE METABOLIC PANEL
ALBUMIN: 3.7 g/dL (ref 3.5–5.0)
ALT: 12 U/L — ABNORMAL LOW (ref 14–54)
ANION GAP: 4 — AB (ref 5–15)
AST: 15 U/L (ref 15–41)
Alkaline Phosphatase: 42 U/L (ref 38–126)
BUN: 9 mg/dL (ref 6–20)
CO2: 23 mmol/L (ref 22–32)
Calcium: 9.1 mg/dL (ref 8.9–10.3)
Chloride: 107 mmol/L (ref 101–111)
Creatinine, Ser: 0.66 mg/dL (ref 0.44–1.00)
GFR calc Af Amer: >60 mL/min (ref >60)
GFR calc non Af Amer: >60 mL/min (ref >60)
GLUCOSE: 124 mg/dL — AB (ref 65–99)
POTASSIUM: 3.3 mmol/L — AB (ref 3.5–5.1)
Sodium: 134 mmol/L — ABNORMAL LOW (ref 135–145)
Total Bilirubin: 0.4 mg/dL (ref 0.3–1.2)
Total Protein: 7 g/dL (ref 6.5–8.1)

## 2015-06-03 LAB — CBC WITH DIFFERENTIAL/PLATELET
Basophils Absolute: 0 10*3/uL (ref 0–0.1)
Basophils Relative: 1 %
EOS PCT: 1 %
Eosinophils Absolute: 0.1 10*3/uL (ref 0–0.7)
HEMATOCRIT: 33.8 % — AB (ref 35.0–47.0)
Hemoglobin: 11.4 g/dL — ABNORMAL LOW (ref 12.0–16.0)
LYMPHS PCT: 36 %
Lymphs Abs: 1.7 10*3/uL (ref 1.0–3.6)
MCH: 30.7 pg (ref 26.0–34.0)
MCHC: 33.8 g/dL (ref 32.0–36.0)
MCV: 91 fL (ref 80.0–100.0)
MONO ABS: 0.3 10*3/uL (ref 0.2–0.9)
Monocytes Relative: 6 %
NEUTROS ABS: 2.6 10*3/uL (ref 1.4–6.5)
Neutrophils Relative %: 56 %
PLATELETS: 275 10*3/uL (ref 150–440)
RBC: 3.72 MIL/uL — ABNORMAL LOW (ref 3.80–5.20)
RDW: 14.2 % (ref 11.5–14.5)
WBC: 4.7 10*3/uL (ref 3.6–11.0)

## 2015-06-03 LAB — URINALYSIS COMPLETE WITH MICROSCOPIC (ARMC ONLY)
BACTERIA UA: NONE SEEN
Bilirubin Urine: NEGATIVE
Glucose, UA: NEGATIVE mg/dL
Hgb urine dipstick: NEGATIVE
Ketones, ur: NEGATIVE mg/dL
Leukocytes, UA: NEGATIVE
Nitrite: NEGATIVE
Protein, ur: NEGATIVE mg/dL
RBC / HPF: NONE SEEN RBC/hpf (ref 0–5)
SPECIFIC GRAVITY, URINE: 1.02 (ref 1.005–1.030)
pH: 5 (ref 5.0–8.0)

## 2015-06-03 LAB — HCG, QUANTITATIVE, PREGNANCY: HCG, BETA CHAIN, QUANT, S: 172790 m[IU]/mL — AB (ref <5)

## 2015-06-03 LAB — LIPASE, BLOOD: Lipase: 21 U/L (ref 11–51)

## 2015-06-03 LAB — POCT PREGNANCY, URINE: PREG TEST UR: POSITIVE — AB

## 2015-06-03 NOTE — ED Notes (Addendum)
C/o abd pain and n/v x 2 days.  Skin w/d.  NAD at this time.

## 2015-06-03 NOTE — Discharge Instructions (Signed)
First Trimester of Pregnancy The first trimester of pregnancy is from week 1 until the end of week 12 (months 1 through 3). A week after a sperm fertilizes an egg, the egg will implant on the wall of the uterus. This embryo will begin to develop into a baby. Genes from you and your partner are forming the baby. The female genes determine whether the baby is a boy or a girl. At 6-8 weeks, the eyes and face are formed, and the heartbeat can be seen on ultrasound. At the end of 12 weeks, all the baby's organs are formed.  Now that you are pregnant, you will want to do everything you can to have a healthy baby. Two of the most important things are to get good prenatal care and to follow your health care provider's instructions. Prenatal care is all the medical care you receive before the baby's birth. This care will help prevent, find, and treat any problems during the pregnancy and childbirth. BODY CHANGES Your body goes through many changes during pregnancy. The changes vary from woman to woman.   You may gain or lose a couple of pounds at first.  You may feel sick to your stomach (nauseous) and throw up (vomit). If the vomiting is uncontrollable, call your health care provider.  You may tire easily.  You may develop headaches that can be relieved by medicines approved by your health care provider.  You may urinate more often. Painful urination may mean you have a bladder infection.  You may develop heartburn as a result of your pregnancy.  You may develop constipation because certain hormones are causing the muscles that push waste through your intestines to slow down.  You may develop hemorrhoids or swollen, bulging veins (varicose veins).  Your breasts may begin to grow larger and become tender. Your nipples may stick out more, and the tissue that surrounds them (areola) may become darker.  Your gums may bleed and may be sensitive to brushing and flossing.  Dark spots or blotches (chloasma,  mask of pregnancy) may develop on your face. This will likely fade after the baby is born.  Your menstrual periods will stop.  You may have a loss of appetite.  You may develop cravings for certain kinds of food.  You may have changes in your emotions from day to day, such as being excited to be pregnant or being concerned that something may go wrong with the pregnancy and baby.  You may have more vivid and strange dreams.  You may have changes in your hair. These can include thickening of your hair, rapid growth, and changes in texture. Some women also have hair loss during or after pregnancy, or hair that feels dry or thin. Your hair will most likely return to normal after your baby is born. WHAT TO EXPECT AT YOUR PRENATAL VISITS During a routine prenatal visit:  You will be weighed to make sure you and the baby are growing normally.  Your blood pressure will be taken.  Your abdomen will be measured to track your baby's growth.  The fetal heartbeat will be listened to starting around week 10 or 12 of your pregnancy.  Test results from any previous visits will be discussed. Your health care provider may ask you:  How you are feeling.  If you are feeling the baby move.  If you have had any abnormal symptoms, such as leaking fluid, bleeding, severe headaches, or abdominal cramping.  If you are using any tobacco products,   including cigarettes, chewing tobacco, and electronic cigarettes.  If you have any questions. Other tests that may be performed during your first trimester include:  Blood tests to find your blood type and to check for the presence of any previous infections. They will also be used to check for low iron levels (anemia) and Rh antibodies. Later in the pregnancy, blood tests for diabetes will be done along with other tests if problems develop.  Urine tests to check for infections, diabetes, or protein in the urine.  An ultrasound to confirm the proper growth  and development of the baby.  An amniocentesis to check for possible genetic problems.  Fetal screens for spina bifida and Down syndrome.  You may need other tests to make sure you and the baby are doing well.  HIV (human immunodeficiency virus) testing. Routine prenatal testing includes screening for HIV, unless you choose not to have this test. HOME CARE INSTRUCTIONS  Medicines  Follow your health care provider's instructions regarding medicine use. Specific medicines may be either safe or unsafe to take during pregnancy.  Take your prenatal vitamins as directed.  If you develop constipation, try taking a stool softener if your health care provider approves. Diet  Eat regular, well-balanced meals. Choose a variety of foods, such as meat or vegetable-based protein, fish, milk and low-fat dairy products, vegetables, fruits, and whole grain breads and cereals. Your health care provider will help you determine the amount of weight gain that is right for you.  Avoid raw meat and uncooked cheese. These carry germs that can cause birth defects in the baby.  Eating four or five small meals rather than three large meals a day may help relieve nausea and vomiting. If you start to feel nauseous, eating a few soda crackers can be helpful. Drinking liquids between meals instead of during meals also seems to help nausea and vomiting.  If you develop constipation, eat more high-fiber foods, such as fresh vegetables or fruit and whole grains. Drink enough fluids to keep your urine clear or pale yellow. Activity and Exercise  Exercise only as directed by your health care provider. Exercising will help you:  Control your weight.  Stay in shape.  Be prepared for labor and delivery.  Experiencing pain or cramping in the lower abdomen or low back is a good sign that you should stop exercising. Check with your health care provider before continuing normal exercises.  Try to avoid standing for long  periods of time. Move your legs often if you must stand in one place for a long time.  Avoid heavy lifting.  Wear low-heeled shoes, and practice good posture.  You may continue to have sex unless your health care provider directs you otherwise. Relief of Pain or Discomfort  Wear a good support bra for breast tenderness.   Take warm sitz baths to soothe any pain or discomfort caused by hemorrhoids. Use hemorrhoid cream if your health care provider approves.   Rest with your legs elevated if you have leg cramps or low back pain.  If you develop varicose veins in your legs, wear support hose. Elevate your feet for 15 minutes, 3-4 times a day. Limit salt in your diet. Prenatal Care  Schedule your prenatal visits by the twelfth week of pregnancy. They are usually scheduled monthly at first, then more often in the last 2 months before delivery.  Write down your questions. Take them to your prenatal visits.  Keep all your prenatal visits as directed by your   health care provider. Safety  Wear your seat belt at all times when driving.  Make a list of emergency phone numbers, including numbers for family, friends, the hospital, and police and fire departments. General Tips  Ask your health care provider for a referral to a local prenatal education class. Begin classes no later than at the beginning of month 6 of your pregnancy.  Ask for help if you have counseling or nutritional needs during pregnancy. Your health care provider can offer advice or refer you to specialists for help with various needs.  Do not use hot tubs, steam rooms, or saunas.  Do not douche or use tampons or scented sanitary pads.  Do not cross your legs for long periods of time.  Avoid cat litter boxes and soil used by cats. These carry germs that can cause birth defects in the baby and possibly loss of the fetus by miscarriage or stillbirth.  Avoid all smoking, herbs, alcohol, and medicines not prescribed by  your health care provider. Chemicals in these affect the formation and growth of the baby.  Do not use any tobacco products, including cigarettes, chewing tobacco, and electronic cigarettes. If you need help quitting, ask your health care provider. You may receive counseling support and other resources to help you quit.  Schedule a dentist appointment. At home, brush your teeth with a soft toothbrush and be gentle when you floss. SEEK MEDICAL CARE IF:   You have dizziness.  You have mild pelvic cramps, pelvic pressure, or nagging pain in the abdominal area.  You have persistent nausea, vomiting, or diarrhea.  You have a bad smelling vaginal discharge.  You have pain with urination.  You notice increased swelling in your face, hands, legs, or ankles. SEEK IMMEDIATE MEDICAL CARE IF:   You have a fever.  You are leaking fluid from your vagina.  You have spotting or bleeding from your vagina.  You have severe abdominal cramping or pain.  You have rapid weight gain or loss.  You vomit blood or material that looks like coffee grounds.  You are exposed to German measles and have never had them.  You are exposed to fifth disease or chickenpox.  You develop a severe headache.  You have shortness of breath.  You have any kind of trauma, such as from a fall or a car accident.   This information is not intended to replace advice given to you by your health care provider. Make sure you discuss any questions you have with your health care provider.   Document Released: 01/27/2001 Document Revised: 02/23/2014 Document Reviewed: 12/13/2012 Elsevier Interactive Patient Education 2016 [employer]  

## 2015-06-03 NOTE — ED Notes (Addendum)
A/o, vss. NAD

## 2015-06-03 NOTE — ED Provider Notes (Signed)
[facility] Emergency Department Provider Note  ____________________________________________  Time seen: 12:15PM  I have reviewed the triage vital signs and the nursing notes.   HISTORY  Chief Complaint Abdominal Pain    HPI Robin Davenport is a 30 y.o. female G5 para 5 presents with left lower quadrant abdominal pain accompanied by nausea and vomiting intermittently times past 2 days. Patient states that the symptoms last approximately 30 minutes with spontaneous resolution. Patient denies any fever no dysuria no vaginal discharge. Patient does not recall the last 8 of her menstrual period. Patient does admit to unprotected sex.    Past Medical History  Diagnosis Date  . Anemia   . Diabetes mellitus without complication [facility](HCC)     Patient Active Problem List   Diagnosis Date Noted  . Delivered by cesarean section 06/26/2014  . Diabetes mellitus arising in pregnancy 05/12/2014  . 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 Cesarean section  06/26/2014    Procedure: CESAREAN SECTION;  Surgeon: [place] Bingharlie Pickens, MD;  Location: [facility] ORS;  Service: Obstetrics;;    Current Outpatient Rx  Name  Route  Sig  Dispense  Refill  . acetaminophen (TYLENOL) 325 MG tablet   Oral   Take 2 tablets (650 mg total) by mouth every 4 (four) hours as needed (for pain scale < 4).   120 tablet   1   . docusate sodium (COLACE) 100 MG capsule   Oral   Take 1 capsule (100 mg total) by mouth daily.   60 capsule   1   . ibuprofen (ADVIL,MOTRIN) 600 MG tablet   Oral   Take 1 tablet (600 mg total) by mouth every 6 (six) hours as needed for mild pain.   120 tablet   1   . magic mouthwash w/lidocaine SOLN   Oral   Take 5 mLs by mouth 4 (four) times daily.   240 mL   0     Dispense in a 1/1/1/1 ratio. Use lidocaine, diphen ...   . oxyCODONE (OXY IR/ROXICODONE) 5 MG immediate release tablet   Oral   Take 1 tablet (5 mg total) by  mouth every 6 (six) hours as needed for severe pain.   30 tablet   0   . Prenatal Vit-Fe Fumarate-FA (PRENATAL MULTIVITAMIN) TABS tablet   Oral   Take 1 tablet by mouth daily.         Marland Kitchen. sulfamethoxazole-trimethoprim (BACTRIM DS,SEPTRA DS) 800-160 MG per tablet   Oral   Take 1 tablet by mouth every 12 (twelve) hours.   13 tablet   0     Allergies No known drug allergies No family history on file.  Social History Social History  Substance Use Topics  . Smoking status: Never Smoker   . Smokeless tobacco: Never Used  . Alcohol Use: None    Review of Systems  Constitutional: Negative for fever. Eyes: Negative for visual changes. ENT: Negative for sore throat. Cardiovascular: Negative for chest pain. Respiratory: Negative for shortness of breath. Gastrointestinal: Negative for abdominal pain, vomiting and diarrhea. Genitourinary: Negative for dysuria. Positive for left pelvic pain Musculoskeletal: Negative for back pain. Skin: Negative for rash. Neurological: Negative for headaches, focal weakness or numbness.   10-point ROS otherwise negative.  ____________________________________________   PHYSICAL EXAM:  VITAL SIGNS: ED Triage Vitals  Enc Vitals Group     BP 06/03/15 1018 137/55 mmHg     Pulse Rate 06/03/15 1018 89  Resp 06/03/15 1018 18     Temp 06/03/15 1018 98.3 F (36.8 C)     Temp Source 06/03/15 1018 Oral     SpO2 06/03/15 1018 98 %     Weight 06/03/15 1018 200 lb (90.719 kg)     Height 06/03/15 1018  (1.676 m)     Head Cir --      Peak Flow --      Pain Score 06/03/15 1019 6     Pain Loc --      Pain Edu? --      Excl. in GC? --      Constitutional: Alert and oriented. Well appearing and in no distress. Eyes: Conjunctivae are normal. PERRL. Normal extraocular movements. ENT   Head: Normocephalic and atraumatic.   Nose: No congestion/rhinnorhea.   Mouth/Throat: Mucous membranes are moist.   Neck: No  stridor. Hematological/Lymphatic/Immunilogical: No cervical lymphadenopathy. Cardiovascular: Normal rate, regular rhythm. Normal and symmetric distal pulses are present in all extremities. No murmurs, rubs, or gallops. Respiratory: Normal respiratory effort without tachypnea nor retractions. Breath sounds are clear and equal bilaterally. No wheezes/rales/rhonchi. Gastrointestinal: Soft and nontender. No distention. There is no CVA tenderness. Genitourinary: deferred Musculoskeletal: Nontender with normal range of motion in all extremities. No joint effusions.  No lower extremity tenderness nor edema. Neurologic:  Normal speech and language. No gross focal neurologic deficits are appreciated. Speech is normal.  Skin:  Skin is warm, dry and intact. No rash noted. Psychiatric: Mood and affect are normal. Speech and behavior are normal. Patient exhibits appropriate insight and judgment.  ____________________________________________    LABS (pertinent positives/negatives)  Labs Reviewed  COMPREHENSIVE METABOLIC PANEL - Abnormal; Notable for the following:    Sodium 134 (*)    Potassium 3.3 (*)    Glucose, Bld 124 (*)    ALT 12 (*)    Anion gap 4 (*)    All other components within normal limits  CBC WITH DIFFERENTIAL/PLATELET - Abnormal; Notable for the following:    RBC 3.72 (*)    Hemoglobin 11.4 (*)    HCT 33.8 (*)    All other components within normal limits  URINALYSIS COMPLETEWITH MICROSCOPIC ([facility] ONLY) - Abnormal; Notable for the following:    Color, Urine YELLOW (*)    APPearance CLEAR (*)    Squamous Epithelial / LPF 0-5 (*)    All other components within normal limits  LIPASE, BLOOD       RADIOLOGY  US OB Comp Less 14 Wks (Final result) Result time: 06/03/15 13:52:08   Final result by Rad Results In Interface (06/03/15 13:52:08)   Narrative:   CLINICAL DATA: Left pelvic pain, nausea and vomiting for 2 days. Quantitative HCG 172790.  EXAM: OBSTETRIC <14 WK [place]  AND TRANSVAGINAL OB US  TECHNIQUE: Both transabdominal and transvaginal ultrasound examinations were performed for complete evaluation of the gestation as well as the maternal uterus, adnexal regions, and pelvic cul-de-sac. Transvaginal technique was performed to assess early pregnancy.  COMPARISON: None.  FINDINGS: Intrauterine gestational sac: Visualized.  Yolk sac: Visualized.  Embryo: Visualized.  Cardiac Activity: Detected.  Heart Rate: 169 bpm  CRL: 41.9 mm  11 w  0 d         [place] EDC: 12/23/2015.  Subchorionic hemorrhage: None visualized.  Maternal uterus/adnexae: At 9 Sir unremarkable. 6.7 cm posterior uterine fibroid in the fundus is noted.  IMPRESSION: Single living anterior pregnancy. No acute abnormality.   Electronically Signed By: Drusilla Kanner M.D. On: 06/03/2015 13:52  INITIAL IMPRESSION / ASSESSMENT AND PLAN / ED COURSE  Pertinent labs & imaging results that were available during my care of the patient were reviewed by me and considered in my medical decision making (see chart for details).    ____________________________________________   FINAL CLINICAL IMPRESSION(S) / ED DIAGNOSES  Final diagnoses:  First trimester pregnancy      Darci Current, MD 06/03/15 1358

## 2015-08-10 ENCOUNTER — Emergency Department: Payer: Medicaid Other

## 2015-08-10 ENCOUNTER — Encounter: Payer: Self-pay | Admitting: *Deleted

## 2015-08-10 ENCOUNTER — Emergency Department
Admission: EM | Admit: 2015-08-10 | Discharge: 2015-08-10 | Disposition: A | Discharge status: Home / Self Care | Payer: Medicaid Other | Attending: Emergency Medicine | Admitting: Emergency Medicine

## 2015-08-10 DIAGNOSIS — R1011 Right upper quadrant pain: Secondary | ICD-10-CM | POA: Diagnosis not present

## 2015-08-10 DIAGNOSIS — O26899 Other specified pregnancy related conditions, unspecified trimester: Secondary | ICD-10-CM | POA: Insufficient documentation

## 2015-08-10 DIAGNOSIS — R102 Pelvic and perineal pain: Secondary | ICD-10-CM

## 2015-08-10 DIAGNOSIS — O24410 Gestational diabetes mellitus in pregnancy, diet controlled: Secondary | ICD-10-CM | POA: Insufficient documentation

## 2015-08-10 DIAGNOSIS — K8050 Calculus of bile duct without cholangitis or cholecystitis without obstruction: Secondary | ICD-10-CM | POA: Insufficient documentation

## 2015-08-10 LAB — COMPREHENSIVE METABOLIC PANEL
ALT: 11 U/L — ABNORMAL LOW (ref 14–54)
ANION GAP: 7 (ref 5–15)
AST: 19 U/L (ref 15–41)
Albumin: 3.3 g/dL — ABNORMAL LOW (ref 3.5–5.0)
Alkaline Phosphatase: 65 U/L (ref 38–126)
BUN: 6 mg/dL (ref 6–20)
CO2: 24 mmol/L (ref 22–32)
Calcium: 9.3 mg/dL (ref 8.9–10.3)
Chloride: 106 mmol/L (ref 101–111)
Creatinine, Ser: 0.68 mg/dL (ref 0.44–1.00)
GFR calc Af Amer: >60 mL/min (ref >60)
GFR calc non Af Amer: >60 mL/min (ref >60)
GLUCOSE: 109 mg/dL — AB (ref 65–99)
POTASSIUM: 3.2 mmol/L — AB (ref 3.5–5.1)
SODIUM: 137 mmol/L (ref 135–145)
Total Bilirubin: 0.5 mg/dL (ref 0.3–1.2)
Total Protein: 7.1 g/dL (ref 6.5–8.1)

## 2015-08-10 LAB — CBC
HEMATOCRIT: 32 % — AB (ref 35.0–47.0)
Hemoglobin: 11.4 g/dL — ABNORMAL LOW (ref 12.0–16.0)
MCH: 32.1 pg (ref 26.0–34.0)
MCHC: 35.7 g/dL (ref 32.0–36.0)
MCV: 89.9 fL (ref 80.0–100.0)
Platelets: 269 10*3/uL (ref 150–440)
RBC: 3.56 MIL/uL — ABNORMAL LOW (ref 3.80–5.20)
RDW: 13.6 % (ref 11.5–14.5)
WBC: 5.4 10*3/uL (ref 3.6–11.0)

## 2015-08-10 LAB — URINALYSIS COMPLETE WITH MICROSCOPIC (ARMC ONLY)
Glucose, UA: NEGATIVE mg/dL
Hgb urine dipstick: NEGATIVE
Ketones, ur: NEGATIVE mg/dL
Leukocytes, UA: NEGATIVE
Nitrite: NEGATIVE
PROTEIN: 30 mg/dL — AB
SPECIFIC GRAVITY, URINE: 1.023 (ref 1.005–1.030)
pH: 7 (ref 5.0–8.0)

## 2015-08-10 LAB — POCT PREGNANCY, URINE: PREG TEST UR: POSITIVE — AB

## 2015-08-10 LAB — CHLAMYDIA/NGC RT PCR (ARMC ONLY)
CHLAMYDIA TR: NOT DETECTED
N gonorrhoeae: NOT DETECTED

## 2015-08-10 LAB — WET PREP, GENITAL
Clue Cells Wet Prep HPF POC: NONE SEEN
Trich, Wet Prep: NONE SEEN
Yeast Wet Prep HPF POC: NONE SEEN

## 2015-08-10 LAB — HCG, QUANTITATIVE, PREGNANCY: HCG, BETA CHAIN, QUANT, S: 23484 m[IU]/mL — AB (ref <5)

## 2015-08-10 LAB — LIPASE, BLOOD: Lipase: 24 U/L (ref 11–51)

## 2015-08-10 MED ORDER — METOCLOPRAMIDE HCL 10 MG PO TABS: 10.0000 mg | ORAL_TABLET | Freq: Four times a day (QID) | ORAL | Status: DC | PRN

## 2015-08-10 MED ORDER — METOCLOPRAMIDE HCL 5 MG/ML IJ SOLN
10.0000 mg | Freq: Once | INTRAMUSCULAR | Status: AC
Start: 2015-08-10 — End: 2015-08-10
  Administered 2015-08-10: 10 mg via INTRAVENOUS
  Filled 2015-08-10: qty 2

## 2015-08-10 MED ORDER — MORPHINE SULFATE (PF) 4 MG/ML IV SOLN
4.0000 mg | Freq: Once | INTRAVENOUS | Status: AC
  Administered 2015-08-10: 4 mg via INTRAVENOUS
  Filled 2015-08-10: qty 1

## 2015-08-10 MED ORDER — SODIUM CHLORIDE 0.9 % IV BOLUS (SEPSIS)
1000.0000 mL | Freq: Once | INTRAVENOUS | Status: AC
  Administered 2015-08-10: 1000 mL via INTRAVENOUS

## 2015-08-10 MED ORDER — PRENATAL VITAMINS 0.8 MG PO TABS: 1.0000 | ORAL_TABLET | Freq: Every day | ORAL | Status: AC

## 2015-08-10 NOTE — ED Provider Notes (Signed)
[facility] Emergency Department Provider Note   ____________________________________________  Time seen: Approximately 650 PM  I have reviewed the triage vital signs and the nursing notes.   HISTORY  Chief Complaint Abdominal Pain   HPI Leani Myron is a 30 y.o. female with a history of anemia as well as diabetes during pregnancy was presenting to the emergency department with right upper quadrant abdominal pain over the past week. Says the pain is a 10 and 10 right now and sharp. She says it has been constant today which is what made her come in to the emergency department. She also says that she has vomited today and last night. Says the pain radiates to her back. Denies any vaginal bleeding or discharge. Denies any history of STDs. This is her sixth pregnancy. Does not know how far along she is. Has not established prenatal care.   Past Medical History  Diagnosis Date  . Anemia   . Diabetes mellitus without complication [facility])     Patient Active Problem List   Diagnosis Date Noted  . Delivered by cesarean section 06/26/2014  . Diabetes mellitus arising in pregnancy 05/12/2014  . 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 Cesarean section  06/26/2014    Procedure: CESAREAN SECTION;  Surgeon:  Bing, MD;  Location: [facility] ORS;  Service: Obstetrics;;    Current Outpatient Rx  Name  Route  Sig  Dispense  Refill  . acetaminophen (TYLENOL) 325 MG tablet   Oral   Take 2 tablets (650 mg total) by mouth every 4 (four) hours as needed (for pain scale < 4).   120 tablet   1   . docusate sodium (COLACE) 100 MG capsule   Oral   Take 1 capsule (100 mg total) by mouth daily.   60 capsule   1   . ibuprofen (ADVIL,MOTRIN) 600 MG tablet   Oral   Take 1 tablet (600 mg total) by mouth every 6 (six) hours as needed for mild pain.   120 tablet   1   . magic mouthwash w/lidocaine SOLN   Oral   Take 5 mLs  by mouth 4 (four) times daily.   240 mL   0     Dispense in a 1/1/1/1 ratio. Use lidocaine, diphen ...   . oxyCODONE (OXY IR/ROXICODONE) 5 MG immediate release tablet   Oral   Take 1 tablet (5 mg total) by mouth every 6 (six) hours as needed for severe pain.   30 tablet   0   . sulfamethoxazole-trimethoprim (BACTRIM DS,SEPTRA DS) 800-160 MG per tablet   Oral   Take 1 tablet by mouth every 12 (twelve) hours.   13 tablet   0     Allergies Review of patient's allergies indicates no known allergies.  No family history on file.  Social History Social History  Substance Use Topics  . Smoking status: Never Smoker   . Smokeless tobacco: Never Used  . Alcohol Use: No    Review of Systems Constitutional: No fever/chills Eyes: No visual changes. ENT: No sore throat. Cardiovascular: Denies chest pain. Respiratory: Denies shortness of breath. Gastrointestinal: No diarrhea.  No constipation. Genitourinary: Negative for dysuria. Musculoskeletal: As above Skin: Negative for rash. Neurological: Negative for headaches, focal weakness or numbness.  10-point ROS otherwise negative.  ____________________________________________   PHYSICAL EXAM:  VITAL SIGNS: ED Triage Vitals  Enc Vitals Group     BP 08/10/15 1726 120/47 mmHg  Pulse Rate 08/10/15 1726 90     Resp 08/10/15 1726 20     Temp 08/10/15 1726 98.4 F (36.9 C)     Temp Source 08/10/15 1726 Oral     SpO2 08/10/15 1726 100 %     Weight 08/10/15 1726 250 lb (113.399 kg)     Height 08/10/15 1726 5\' 7"  (1.702 m)     Head Cir --      Peak Flow --      Pain Score 08/10/15 1724 10     Pain Loc --      Pain Edu? --      Excl. in GC? --     Constitutional: Alert and oriented. Well appearing and in no acute distress. Eyes: Conjunctivae are normal. PERRL. EOMI. Head: Atraumatic. Nose: No congestion/rhinnorhea. Mouth/Throat: Mucous membranes are moist.  Neck: No stridor.   Cardiovascular: Normal rate, regular  rhythm. Grossly normal heart sounds.  Good peripheral circulation. Respiratory: Normal respiratory effort.  No retractions. Lungs CTAB. Gastrointestinal: Soft with right upper quadrant tenderness to palpation with a positive Murphy sign. No flank or lower abdominal tenderness palpation. No distention.  No CVA tenderness. Genitourinary:  Normal external exam. Speculum exam with a small amount of yellow discharge from the cervix. Bimanual exam with mild CMT with a closed cervical os. No uterine or adnexal tenderness nor masses. Musculoskeletal: No lower extremity tenderness nor edema.  No joint effusions. Neurologic:  Normal speech and language. No gross focal neurologic deficits are appreciated. Skin:  Skin is warm, dry and intact. No rash noted. Psychiatric: Mood and affect are normal. Speech and behavior are normal.  ____________________________________________   LABS (all labs ordered are listed, but only abnormal results are displayed)  Labs Reviewed  WET PREP, GENITAL - Abnormal; Notable for the following:    WBC, Wet Prep HPF POC MANY (*)    All other components within normal limits  COMPREHENSIVE METABOLIC PANEL - Abnormal; Notable for the following:    Potassium 3.2 (*)    Glucose, Bld 109 (*)    Albumin 3.3 (*)    ALT 11 (*)    All other components within normal limits  CBC - Abnormal; Notable for the following:    RBC 3.56 (*)    Hemoglobin 11.4 (*)    HCT 32.0 (*)    All other components within normal limits  URINALYSIS COMPLETEWITH MICROSCOPIC ([facility] ONLY) - Abnormal; Notable for the following:    Color, Urine AMBER (*)    APPearance CLEAR (*)    Bilirubin Urine 1+ (*)    Protein, ur 30 (*)    Bacteria, UA RARE (*)    Squamous Epithelial / LPF 0-5 (*)    All other components within normal limits  HCG, QUANTITATIVE, PREGNANCY - Abnormal; Notable for the following:    hCG, Beta Chain, Quant, S [id number] (*)    All other components within normal limits  POCT PREGNANCY,  URINE - Abnormal; Notable for the following:    Preg Test, Ur POSITIVE (*)    All other components within normal limits  CHLAMYDIA/NGC RT PCR ([facility] ONLY)  LIPASE, BLOOD  POC URINE PREG, ED   ____________________________________________  EKG   ____________________________________________  RADIOLOGY  US OB Limited (Final result) Result time: 08/10/15 21:38:39   Procedure changed from US OB Comp Less 14 Wks      Final result by Rad Results In Interface (08/10/15 21:38:39)   Narrative:   CLINICAL DATA: 30 year old pregnant female with right upper  quadrant pelvic.  EXAM: LIMITED OBSTETRIC ULTRASOUND  FINDINGS: Number of Fetuses: Single  Heart Rate: 145 bpm  Movement: Seen  Presentation: Cephalic  Placental Location: Anterior.  Previa: None  Amniotic Fluid (Subjective): Within normal limits. Multiple venous lakes noted in the placenta.  BPD: 4.5cm 19w foldd  MATERNAL FINDINGS:  Cervix: Appears closed.  Uterus/Adnexae: The ovaries are not visualized.  IMPRESSION: Single live intrauterine pregnancy. No acute abnormality identified.  This exam is performed on an emergent basis and does not comprehensively evaluate fetal size, dating, or anatomy; follow-up complete OB US should be considered if further fetal assessment is warranted.   Electronically Signed By: Elgie Collard M.D. On: 08/10/2015 21:38          US Abdomen Limited RUQ (Final result) Result time: 08/10/15 21:08:40   Final result by Rad Results In Interface (08/10/15 21:08:40)   Narrative:   CLINICAL DATA: Acute onset of right upper quadrant abdominal pain and pelvic pain. Initial encounter.  EXAM: US ABDOMEN LIMITED - RIGHT UPPER QUADRANT  COMPARISON: None.  FINDINGS: Gallbladder:  Stones and sludge are noted within the gallbladder. Stones measure up to 8 mm in size. No gallbladder wall thickening or pericholecystic fluid is seen. The patient has  been given pain medication, limiting evaluation for ultrasonographic Murphy's sign.  Common bile duct:  Diameter: 0.7 cm, borderline prominent, though this may reflect the patient's baseline.  Liver:  No focal lesion identified. Within normal limits in parenchymal echogenicity.  IMPRESSION: Stones and sludge noted within the gallbladder. No evidence for cholecystitis. Borderline prominent common bile duct may remain within normal limits, without definite evidence for obstruction.   Electronically Signed By: Roanna Raider M.D. On: 08/10/2015 21:08    ____________________________________________   PROCEDURES   ____________________________________________   INITIAL IMPRESSION / ASSESSMENT AND PLAN / ED COURSE  Pertinent labs & imaging results that were available during my care of the patient were reviewed by me and considered in my medical decision making (see chart for details).  ----------------------------------------- 10:50 PM on 08/10/2015 -----------------------------------------  Patient is resting comfortably at this time. I discussed the findings with Dr. Excell Seltzer who does not consider the patient is surgical candidate right now because of her reassuring blood work. Upon reassessment the patient has improved pain. She is tolerating by mouth solids as well as liquids. The risks outweigh the benefits at this time for surgery in this pregnant patient. It is unclear exactly how advanced she is in her pregnancy that the ultrasound tech with suspected she could be up to 20 weeks. I explain to the patient. She normally follows up with [facility]. She'll also be following up with Dr. Excell Seltzer in the clinic. We reviewed her radiology results and she is aware of the gallbladder as well as her pregnancy findings. She knows to return to the emergency department for any worsening or concerning symptoms. I'll discharge her with Reglan and she knows that she must only take Tylenol at  home because other pain medications may be harmful for the fetus. She is understanding of the plan and willing to comply. Will also be discharged with prenatal vitamins. ____________________________________________   FINAL CLINICAL IMPRESSION(S) / ED DIAGNOSES  Final diagnoses:  Pelvic pain affecting pregnancy  Biliary colic. Cholelithiasis.    NEW MEDICATIONS STARTED DURING THIS VISIT:  New Prescriptions   No medications on file     Note:  This document was prepared using Dragon voice recognition software and may include unintentional dictation errors.    Myra Rude  Arika Mainer, MD 08/10/15 2252

## 2015-08-10 NOTE — ED Notes (Signed)
Pt to triage via wheelchair.  Pt reports abd pain.  States mid back hurting.  Denies dysuria. vomited x 2 today.   No vag bleeding or discharge.

## 2015-08-12 ENCOUNTER — Encounter: Payer: Self-pay | Admitting: Emergency Medicine

## 2015-08-12 ENCOUNTER — Emergency Department: Payer: Medicaid Other

## 2015-08-12 ENCOUNTER — Observation Stay
Admission: EM | Admit: 2015-08-12 | Discharge: 2015-08-15 | Disposition: A | Discharge status: Home / Self Care | Payer: Medicaid Other | Attending: Obstetrics and Gynecology | Admitting: Obstetrics and Gynecology

## 2015-08-12 DIAGNOSIS — O0932 Supervision of pregnancy with insufficient antenatal care, second trimester: Secondary | ICD-10-CM

## 2015-08-12 DIAGNOSIS — R1011 Right upper quadrant pain: Secondary | ICD-10-CM

## 2015-08-12 DIAGNOSIS — O9952 Diseases of the respiratory system complicating childbirth: Secondary | ICD-10-CM | POA: Insufficient documentation

## 2015-08-12 DIAGNOSIS — K8070 Calculus of gallbladder and bile duct without cholecystitis without obstruction: Secondary | ICD-10-CM | POA: Insufficient documentation

## 2015-08-12 DIAGNOSIS — Z3A21 21 weeks gestation of pregnancy: Secondary | ICD-10-CM

## 2015-08-12 DIAGNOSIS — O26892 Other specified pregnancy related conditions, second trimester: Secondary | ICD-10-CM | POA: Insufficient documentation

## 2015-08-12 DIAGNOSIS — O99612 Diseases of the digestive system complicating pregnancy, second trimester: Secondary | ICD-10-CM | POA: Diagnosis not present

## 2015-08-12 DIAGNOSIS — O99284 Endocrine, nutritional and metabolic diseases complicating childbirth: Secondary | ICD-10-CM | POA: Insufficient documentation

## 2015-08-12 DIAGNOSIS — O9902 Anemia complicating childbirth: Secondary | ICD-10-CM | POA: Diagnosis not present

## 2015-08-12 DIAGNOSIS — Z6841 Body Mass Index (BMI) 40.0 and over, adult: Secondary | ICD-10-CM | POA: Diagnosis not present

## 2015-08-12 DIAGNOSIS — O99214 Obesity complicating childbirth: Secondary | ICD-10-CM | POA: Insufficient documentation

## 2015-08-12 DIAGNOSIS — O34219 Maternal care for unspecified type scar from previous cesarean delivery: Secondary | ICD-10-CM | POA: Diagnosis present

## 2015-08-12 DIAGNOSIS — O24429 Gestational diabetes mellitus in childbirth, unspecified control: Secondary | ICD-10-CM | POA: Insufficient documentation

## 2015-08-12 DIAGNOSIS — O99212 Obesity complicating pregnancy, second trimester: Secondary | ICD-10-CM | POA: Diagnosis present

## 2015-08-12 DIAGNOSIS — O26891 Other specified pregnancy related conditions, first trimester: Secondary | ICD-10-CM | POA: Diagnosis not present

## 2015-08-12 DIAGNOSIS — Z79899 Other long term (current) drug therapy: Secondary | ICD-10-CM | POA: Insufficient documentation

## 2015-08-12 DIAGNOSIS — O0942 Supervision of pregnancy with grand multiparity, second trimester: Secondary | ICD-10-CM

## 2015-08-12 DIAGNOSIS — K838 Other specified diseases of biliary tract: Secondary | ICD-10-CM | POA: Insufficient documentation

## 2015-08-12 DIAGNOSIS — Z3A20 20 weeks gestation of pregnancy: Secondary | ICD-10-CM | POA: Diagnosis not present

## 2015-08-12 DIAGNOSIS — K8050 Calculus of bile duct without cholangitis or cholecystitis without obstruction: Secondary | ICD-10-CM | POA: Diagnosis present

## 2015-08-12 DIAGNOSIS — E876 Hypokalemia: Secondary | ICD-10-CM | POA: Diagnosis not present

## 2015-08-12 DIAGNOSIS — K5900 Constipation, unspecified: Secondary | ICD-10-CM | POA: Insufficient documentation

## 2015-08-12 DIAGNOSIS — K839 Disease of biliary tract, unspecified: Secondary | ICD-10-CM | POA: Insufficient documentation

## 2015-08-12 DIAGNOSIS — O0992 Supervision of high risk pregnancy, unspecified, second trimester: Secondary | ICD-10-CM

## 2015-08-12 LAB — COMPREHENSIVE METABOLIC PANEL
ALBUMIN: 3.5 g/dL (ref 3.5–5.0)
ALT: 50 U/L (ref 14–54)
ANION GAP: 9 (ref 5–15)
AST: 67 U/L — ABNORMAL HIGH (ref 15–41)
Alkaline Phosphatase: 99 U/L (ref 38–126)
BILIRUBIN TOTAL: 2.3 mg/dL — AB (ref 0.3–1.2)
BUN: 6 mg/dL (ref 6–20)
CO2: 23 mmol/L (ref 22–32)
Calcium: 9 mg/dL (ref 8.9–10.3)
Chloride: 106 mmol/L (ref 101–111)
Creatinine, Ser: 0.65 mg/dL (ref 0.44–1.00)
GFR calc Af Amer: >60 mL/min (ref >60)
GFR calc non Af Amer: >60 mL/min (ref >60)
GLUCOSE: 91 mg/dL (ref 65–99)
POTASSIUM: 3.1 mmol/L — AB (ref 3.5–5.1)
SODIUM: 138 mmol/L (ref 135–145)
TOTAL PROTEIN: 7.3 g/dL (ref 6.5–8.1)

## 2015-08-12 LAB — URINALYSIS COMPLETE WITH MICROSCOPIC (ARMC ONLY)
BACTERIA UA: NONE SEEN
GLUCOSE, UA: NEGATIVE mg/dL
HGB URINE DIPSTICK: NEGATIVE
LEUKOCYTES UA: NEGATIVE
NITRITE: NEGATIVE
PH: 5 (ref 5.0–8.0)
PROTEIN: 30 mg/dL — AB
RBC / HPF: NONE SEEN RBC/hpf (ref 0–5)
Specific Gravity, Urine: 1.031 — ABNORMAL HIGH (ref 1.005–1.030)
Squamous Epithelial / LPF: NONE SEEN
WBC UA: NONE SEEN WBC/hpf (ref 0–5)

## 2015-08-12 LAB — GLUCOSE, CAPILLARY: GLUCOSE-CAPILLARY: 93 mg/dL (ref 65–99)

## 2015-08-12 LAB — CBC
HEMATOCRIT: 32 % — AB (ref 35.0–47.0)
HEMOGLOBIN: 11.1 g/dL — AB (ref 12.0–16.0)
MCH: 31.4 pg (ref 26.0–34.0)
MCHC: 34.7 g/dL (ref 32.0–36.0)
MCV: 90.6 fL (ref 80.0–100.0)
Platelets: 273 10*3/uL (ref 150–440)
RBC: 3.54 MIL/uL — AB (ref 3.80–5.20)
RDW: 13.7 % (ref 11.5–14.5)
WBC: 4.7 10*3/uL (ref 3.6–11.0)

## 2015-08-12 LAB — LIPASE, BLOOD: Lipase: 22 U/L (ref 11–51)

## 2015-08-12 LAB — HCG, QUANTITATIVE, PREGNANCY: HCG, BETA CHAIN, QUANT, S: 21547 m[IU]/mL — AB (ref <5)

## 2015-08-12 LAB — POCT PREGNANCY, URINE: PREG TEST UR: POSITIVE — AB

## 2015-08-12 MED ORDER — MORPHINE SULFATE (PF) 2 MG/ML IV SOLN
2.0000 mg | INTRAVENOUS | Status: DC | PRN
  Administered 2015-08-12: 2 mg via INTRAVENOUS
  Filled 2015-08-12: qty 1

## 2015-08-12 MED ORDER — PROMETHAZINE HCL 25 MG/ML IJ SOLN
12.5000 mg | Freq: Four times a day (QID) | INTRAMUSCULAR | Status: DC | PRN
  Administered 2015-08-13: 12.5 mg via INTRAVENOUS
  Filled 2015-08-12: qty 1

## 2015-08-12 MED ORDER — ONDANSETRON HCL 4 MG/2ML IJ SOLN
4.0000 mg | Freq: Once | INTRAMUSCULAR | Status: AC
  Administered 2015-08-12: 4 mg via INTRAVENOUS
  Filled 2015-08-12: qty 2

## 2015-08-12 MED ORDER — POTASSIUM CHLORIDE 20 MEQ PO PACK
20.0000 meq | PACK | Freq: Two times a day (BID) | ORAL | Status: AC
  Administered 2015-08-12 – 2015-08-13 (×2): 20 meq via ORAL
  Filled 2015-08-12 (×4): qty 1

## 2015-08-12 MED ORDER — SODIUM CHLORIDE 0.9 % IV BOLUS (SEPSIS)
1000.0000 mL | Freq: Once | INTRAVENOUS | Status: AC
  Administered 2015-08-12: 1000 mL via INTRAVENOUS

## 2015-08-12 MED ORDER — HYDROMORPHONE HCL 1 MG/ML IJ SOLN
0.5000 mg | Freq: Once | INTRAMUSCULAR | Status: AC
  Administered 2015-08-12: 14:00:00 via INTRAVENOUS
  Filled 2015-08-12: qty 1

## 2015-08-12 MED ORDER — PRENATAL MULTIVITAMIN CH
1.0000 | ORAL_TABLET | Freq: Every day | ORAL | Status: DC
Start: 2015-08-13 — End: 2015-08-15
  Administered 2015-08-14: 1 via ORAL
  Filled 2015-08-12: qty 1

## 2015-08-12 MED ORDER — HYDROMORPHONE HCL 1 MG/ML IJ SOLN
0.5000 mg | INTRAMUSCULAR | Status: DC | PRN
  Administered 2015-08-12 – 2015-08-14 (×7): 0.5 mg via INTRAVENOUS
  Filled 2015-08-12 (×7): qty 1

## 2015-08-12 MED ORDER — LACTATED RINGERS IV SOLN
125.0000 mL/h | INTRAVENOUS | Status: DC
  Administered 2015-08-12 – 2015-08-15 (×6): 125 mL/h via INTRAVENOUS

## 2015-08-12 NOTE — Consult Note (Signed)
Patient ID: Robin Davenport, female   DOB: 04-24-1985, 30 y.o.   MRN: [id number]  CC: RUQ PAIN  HPI Robin Davenport is a 30 y.o. female presents to the emergency department for evaluation of right upper quadrant abdominal pain. Patient states she's had the pain for over a week. She has been the emergency department multiple times for this. She was seen as recently as Friday and told to avoid fatty foods and provided with medications to assist with symptoms for her biliary colic. She has never had evidence of cholecystitis on her imaging or labs. Patient did not a bite by any of the recommendations or take any medications provided prior to return to the emergency department today complaining of increased right upper quadrant pain. Patient denies any fevers, chills, diarrhea, constipation, vaginal discharge. She's also had some nausea and vomiting but has not taken any medications. Patient is pregnant at [redacted] weeks per ultrasound the patient is unsure of how pregnant she is.  HPI  Past Medical History  Diagnosis Date  . Anemia   . Diabetes mellitus without complication [facility])     Past Surgical History  Procedure Laterality Date  . Cesarean section  06/26/2014    Procedure: CESAREAN SECTION;  Surgeon: Aspers Bingharlie Pickens, MD;  Location: [facility] ORS;  Service: Obstetrics;;    No family history on file. No family history of heart disease or malignancies to the patient's understanding. Patient does think she's had family members with diabetes but unable to provide which family member.  Social History Social History  Substance Use Topics  . Smoking status: Never Smoker   . Smokeless tobacco: Never Used  . Alcohol Use: No    No Known Allergies  No current facility-administered medications for this encounter.   Current Outpatient Prescriptions  Medication Sig Dispense Refill  . acetaminophen (TYLENOL) 325 MG tablet Take 2 tablets (650 mg total) by mouth every 4 (four) hours as needed (for pain scale <  4). 120 tablet 1  . docusate sodium (COLACE) 100 MG capsule Take 1 capsule (100 mg total) by mouth daily. 60 capsule 1  . ibuprofen (ADVIL,MOTRIN) 600 MG tablet Take 1 tablet (600 mg total) by mouth every 6 (six) hours as needed for mild pain. 120 tablet 1  . magic mouthwash w/lidocaine SOLN Take 5 mLs by mouth 4 (four) times daily. 240 mL 0  . metoCLOPramide (REGLAN) 10 MG tablet Take 1 tablet (10 mg total) by mouth every 6 (six) hours as needed. 12 tablet 0  . oxyCODONE (OXY IR/ROXICODONE) 5 MG immediate release tablet Take 1 tablet (5 mg total) by mouth every 6 (six) hours as needed for severe pain. 30 tablet 0  . Prenatal Multivit-Min-Fe-FA (PRENATAL VITAMINS) 0.8 MG tablet Take 1 tablet by mouth daily. 30 tablet 0  . sulfamethoxazole-trimethoprim (BACTRIM DS,SEPTRA DS) 800-160 MG per tablet Take 1 tablet by mouth every 12 (twelve) hours. 13 tablet 0     Review of Systems A Multi-point review of systems was asked and was negative except for the findings documented in history of present illness  Physical Exam Pulse 68, temperature 98.7 F (37.1 C), temperature source Oral, resp. rate 20, height 5\' 4"  (1.626 m), weight 113.399 kg (250 lb), unknown if currently breastfeeding. CONSTITUTIONAL: Resting in bed in no acute distress. EYES: Pupils are equal, round, and reactive to light, Sclera are non-icteric. EARS, NOSE, MOUTH AND THROAT: The oropharynx is clear. The oral mucosa is pink and moist. Hearing is intact to voice. LYMPH NODES:  Lymph  nodes in the neck are normal. RESPIRATORY:  Lungs are clear. There is normal respiratory effort, with equal breath sounds bilaterally, and without pathologic use of accessory muscles. CARDIOVASCULAR: Heart is regular without murmurs, gallops, or rubs. GI: The abdomen is large, gravid, soft, tender to palpation in the entire midepigastrium and right upper quadrant but with a negative Murphy sign, and nondistended. There are no palpable masses. There is no  hepatosplenomegaly. There are normal bowel sounds in all quadrants. GU: Rectal deferred.   MUSCULOSKELETAL: Normal muscle strength and tone. No cyanosis or edema.   SKIN: Turgor is good and there are no pathologic skin lesions or ulcers. NEUROLOGIC: Motor and sensation is grossly normal. Cranial nerves are grossly intact. PSYCH:  Oriented to person, place and time. Affect is normal.  Data Reviewed Images and labs reviewed. Labs do show hyperbilirubinemia at 2.3 however the remainder of her LFTs were within normal limits. She has no leukocytosis with white blood cell count of 4.7. Ultrasound obtained shows possible only to cholelithiasis but no gallbladder wall thickening or pericholecystic fluid to be concern for cholecystitis. I have personally reviewed the patient's imaging, laboratory findings and medical records.    Assessment    Choledocholithiasis    Plan    In the setting of pregnancy and choledocholithiasis this patient would be best served by an ERCP. Although she is in the second trimester and it is technically possible to do a cholecystectomy at this stage this would be only if her symptoms are not relieved by the ERCP. In the ideal setting her gallbladder would be most safely removed in the postpartum period however it can be obtained at any point in the second trimester with marginal risks to the unborn fetus. Discussed with the emergency room provider having patient admitted by either OB/internal medicine with a GI consult for ERCP or transfer to a facility with a provider available for an ERCP if unavailable here. Surgery will follow along with this patient's along she is in this facility.     Time spent with the patient was 30 minutes, with more than 50% of the time spent in face-to-face education, counseling and care coordination.     Ricarda Frameharles Jiyah Torpey, MD FACS General Surgeon 08/12/2015, 2:33 PM

## 2015-08-12 NOTE — ED Provider Notes (Signed)
[facility] Emergency Department Provider Note  ____________________________________________  Time seen: Approximately 12:50 PM  I have reviewed the triage vital signs and the nursing notes.   HISTORY  Chief Complaint Abdominal Pain    HPI Robin Davenport is a 30 y.o. female with the recent diagnosis of biliary colic in pregnancy presenting with worsening right upper quadrant pain, nausea and vomiting. The patient was seen and evaluated 2 days ago in the emergency department was found to be pregnant at greater than 19 weeks, and was found to have biliary colic without evidence of cholecystitis. She was given follow-up instructions for pain control, nausea control, as well as instructions to follow up with OB/GYN and general surgery. She reports that she has taken 650 mg Tylenol one time, and has not taken any of her antiemetic. She has not taken any dietary precautions, but states that she is not eating much due to decreased appetite. She has not made an appointment for follow-up with general surgery.  She denies any fever, chills, vaginal bleeding, but she has had constipation.   Past Medical History  Diagnosis Date  . Anemia   . Diabetes mellitus without complication [facility](HCC)     Patient Active Problem List   Diagnosis Date Noted  . Delivered by cesarean section 06/26/2014  . Diabetes mellitus arising in pregnancy 05/12/2014  . 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 Cesarean section  06/26/2014    Procedure: CESAREAN SECTION;  Surgeon: [place] Bingharlie Pickens, MD;  Location: [facility] ORS;  Service: Obstetrics;;    Current Outpatient Rx  Name  Route  Sig  Dispense  Refill  . acetaminophen (TYLENOL) 325 MG tablet   Oral   Take 2 tablets (650 mg total) by mouth every 4 (four) hours as needed (for pain scale < 4).   120 tablet   1   . docusate sodium (COLACE) 100 MG capsule   Oral   Take 1 capsule (100 mg total) by mouth  daily.   60 capsule   1   . ibuprofen (ADVIL,MOTRIN) 600 MG tablet   Oral   Take 1 tablet (600 mg total) by mouth every 6 (six) hours as needed for mild pain.   120 tablet   1   . magic mouthwash w/lidocaine SOLN   Oral   Take 5 mLs by mouth 4 (four) times daily.   240 mL   0     Dispense in a 1/1/1/1 ratio. Use lidocaine, diphen ...   . metoCLOPramide (REGLAN) 10 MG tablet   Oral   Take 1 tablet (10 mg total) by mouth every 6 (six) hours as needed.   12 tablet   0   . oxyCODONE (OXY IR/ROXICODONE) 5 MG immediate release tablet   Oral   Take 1 tablet (5 mg total) by mouth every 6 (six) hours as needed for severe pain.   30 tablet   0   . Prenatal Multivit-Min-Fe-FA (PRENATAL VITAMINS) 0.8 MG tablet   Oral   Take 1 tablet by mouth daily.   30 tablet   0   . sulfamethoxazole-trimethoprim (BACTRIM DS,SEPTRA DS) 800-160 MG per tablet   Oral   Take 1 tablet by mouth every 12 (twelve) hours.   13 tablet   0     Allergies Review of patient's allergies indicates no known allergies.  No family history on file.  Social History Social History  Substance Use Topics  . Smoking status: Never Smoker   .  Smokeless tobacco: Never Used  . Alcohol Use: No    Review of Systems Constitutional: No fever/chills.No lightheadedness or syncope. Eyes: No visual changes. ENT: No sore throat. No congestion or rhinorrhea. Cardiovascular: Denies chest pain. Denies palpitations. Respiratory: Denies shortness of breath.  No cough. Gastrointestinal: Positive right upper quadrant abdominal pain.  Positive nausea, positive vomiting.  No diarrhea.  Positive constipation. Genitourinary: Negative for dysuria. No vaginal bleeding. Musculoskeletal: Negative for back pain. Skin: Negative for rash. Neurological: Negative for headaches. No focal numbness, tingling or weakness.   10-point ROS otherwise negative.  ____________________________________________   PHYSICAL EXAM:  VITAL  SIGNS: ED Triage Vitals  Enc Vitals Group     BP --      Pulse Rate 08/12/15 1121 68     Resp 08/12/15 1121 20     Temp 08/12/15 1121 98.7 F (37.1 C)     Temp Source 08/12/15 1121 Oral     SpO2 --      Weight 08/12/15 1155 250 lb (113.399 kg)     Height 08/12/15 1155 5\' 4"  (1.626 m)     Head Cir --      Peak Flow --      Pain Score 08/12/15 1123 8     Pain Loc --      Pain Edu? --      Excl. in GC? --     Constitutional: Alert and oriented. Well appearing and in no acute distress. Answers questions appropriately. Eyes: Conjunctivae are normal.  EOMI. No scleral icterus. Head: Atraumatic. Nose: No congestion/rhinnorhea. Mouth/Throat: Mucous membranes are moist.  Neck: No stridor.  Supple.   Cardiovascular: Normal rate, regular rhythm. No murmurs, rubs or gallops.  Respiratory: Normal respiratory effort.  No accessory muscle use or retractions. Lungs CTAB.  No wheezes, rales or ronchi. Gastrointestinal: Soft and nondistended. Positive tenderness to palpation in the right upper quadrant with positive Murphy sign. Gravid uterus to just above the umbilicus that is nontender.  No guarding or rebound.  No peritoneal signs. Musculoskeletal: No LE edema.  Neurologic:  A&Ox3.  Speech is clear.  Face and smile are symmetric.  EOMI.  Moves all extremities well. Skin:  Skin is warm, dry and intact. No rash noted. Psychiatric: Mood and affect are normal. Speech and behavior are normal.  Normal judgement.  ____________________________________________   LABS (all labs ordered are listed, but only abnormal results are displayed)  Labs Reviewed  COMPREHENSIVE METABOLIC PANEL - Abnormal; Notable for the following:    Potassium 3.1 (*)    AST 67 (*)    Total Bilirubin 2.3 (*)    All other components within normal limits  CBC - Abnormal; Notable for the following:    RBC 3.54 (*)    Hemoglobin 11.1 (*)    HCT 32.0 (*)    All other components within normal limits  URINALYSIS  COMPLETEWITH MICROSCOPIC ([facility] ONLY) - Abnormal; Notable for the following:    Color, Urine AMBER (*)    APPearance CLEAR (*)    Bilirubin Urine 2+ (*)    Ketones, ur TRACE (*)    Specific Gravity, Urine 1.031 (*)    Protein, ur 30 (*)    All other components within normal limits  HCG, QUANTITATIVE, PREGNANCY - Abnormal; Notable for the following:    hCG, Beta Chain, Quant, S [id number] (*)    All other components within normal limits  POCT PREGNANCY, URINE - Abnormal; Notable for the following:    Preg Test, Ur POSITIVE (*)  All other components within normal limits  LIPASE, BLOOD   ____________________________________________  EKG  Not indicated ____________________________________________  RADIOLOGY  US Abdomen Limited Ruq  08/12/2015  CLINICAL DATA:  Right upper quadrant abdominal pain. Symptoms for 1 week EXAM: US ABDOMEN LIMITED - RIGHT UPPER QUADRANT COMPARISON:  08/10/2015 FINDINGS: Gallbladder: Re- demonstrated sludge and gallstones. There is no wall thickening or focal tenderness suggestive of acute cholecystitis. Common bile duct: Diameter: 9.5 mm, increased from prior. The distal CBD is obscured by bowel gas but there is suspicious echogenic shadowing structure at the lower margin of the visible CBD. New intrahepatic bile duct dilatation at the hilum. Liver: No focal lesion identified. Within normal limits in parenchymal echogenicity. IMPRESSION: 1. Progressed bile duct dilatation compared to 2 days ago. High likelihood of choledocholithiasis at the distal CBD, but neighboring artifact from bowel gas. 2. Cholelithiasis. Electronically Signed   By: Marnee Spring M.D.   On: 08/12/2015 13:44    ____________________________________________   PROCEDURES  Procedure(s) performed: None  Critical Care performed: No ____________________________________________   INITIAL IMPRESSION / ASSESSMENT AND PLAN / ED COURSE  Pertinent labs & imaging results that were available  during my care of the patient were reviewed by me and considered in my medical decision making (see chart for details).  30 y.o. pregnant female with ongoing symptoms of biliary colic. Today, the patient has stable vital signs and is afebrile. She does have a bump in her bilirubin as well as her AST compared to her previous laboratory studies. Her white blood cell count is normal.  I have paged Dr. Tonita Cong who is currently in surgery but will come to see the patient for further evaluation and treatment. She is symptomatically treatment here, and get a repeat ultrasound to evaluate for signs of cholecystitis today.  ----------------------------------------- 2:33 PM on 08/12/2015 -----------------------------------------  The patient's pain has significantly improved after a single dose of pain medication. She does have an elevated bilirubin daily, as well as imaging which shows progressed bile duct dilatation compared to two days ago.  Dr. Tonita Cong of Gen. surgery has seen and evaluated the patient and without an elevated white blood cell count or evidence of acute cholecystitis, his recommendation is ERCP given her pregnancy. I've spoken with Dr. Servando Snare, who is on-call for GI and will plan to admit the patient to OB/GYN.   ____________________________________________  FINAL CLINICAL IMPRESSION(S) / ED DIAGNOSES  Final diagnoses:  Right upper quadrant pain  Choledocholithiasis      NEW MEDICATIONS STARTED DURING THIS VISIT:  New Prescriptions   No medications on file     Rockne Menghini, MD 08/12/15 1439

## 2015-08-12 NOTE — ED Notes (Signed)
Presents with RUQ and flank pain since last week  States pain has increased   Positive n/v   Pt is also preg but not sure the weeks

## 2015-08-12 NOTE — Consult Note (Signed)
Robin Miniumarren Finnian Husted, MD [facility]  [address] [place], [place] [address] Phone: [phone] Fax : [phone]  Consultation  Referring Provider:     No ref. provider found Primary Care Physician:  Pcp Not In System Primary Gastroenterologist:  None         Reason for Consultation:     Dilated common bile duct  Date of Admission:  08/12/2015 Date of Consultation:  08/12/2015         HPI:   Robin Davenport is a 30 y.o. female who comes in with biliary colic and a ultrasound that showed a dilated common bile duct. The ultrasound report was suggestive of a common bile duct stone.The patient is presently pregnant with her 6th child. The patient's bilirubin has increased from 2 days ago.  It was 0.5 but now it is 2.3.  Her AST has also increased to 67 from 19. I'm now being asked to see the patient for possible ERCP.  Past Medical History  Diagnosis Date  . Anemia   . Diabetes mellitus without complication [facility](HCC)     patient states was in pregnancy only    Past Surgical History  Procedure Laterality Date  . Cesarean section  06/26/2014    Procedure: CESAREAN SECTION;  Surgeon: [place] Bingharlie Pickens, MD;  Location: [facility] ORS;  Service: Obstetrics;;    Prior to Admission medications   Medication Sig Start Date End Date Taking? Authorizing Provider  acetaminophen (TYLENOL) 325 MG tablet Take 650 mg by mouth every 6 (six) hours as needed for mild pain or headache.   Yes Historical Provider, MD  metoCLOPramide (REGLAN) 10 MG tablet Take 1 tablet (10 mg total) by mouth every 6 (six) hours as needed. Patient not taking: Reported on 08/12/2015 08/10/15   Myrna Blazeravid Matthew Schaevitz, MD  Prenatal Multivit-Min-Fe-FA (PRENATAL VITAMINS) 0.8 MG tablet Take 1 tablet by mouth daily. Patient not taking: Reported on 08/12/2015 08/10/15   Myrna Blazeravid Matthew Schaevitz, MD    No family history on file.   Social History  Substance Use Topics  . Smoking status: Never Smoker   . Smokeless tobacco: Never Used  . Alcohol  Use: No    Allergies as of 08/12/2015  . (No Known Allergies)    Review of Systems:    All systems reviewed and negative except where noted in HPI.   Physical Exam:  Vital signs in last 24 hours: Temp:  [98.6 F (37 C)-98.7 F (37.1 C)] 98.6 F (37 C) (06/26 1927) Pulse Rate:  [54-73] 58 (06/26 1927) Resp:  [18-20] 18 (06/26 1927) BP: (96-126)/(50-72) 126/66 mmHg (06/26 1927) SpO2:  [97 %-100 %] 100 % (06/26 1927) Weight:  [250 lb (113.399 kg)] 250 lb (113.399 kg) (06/26 1155)   General:   Pleasant, cooperative in NAD Head:  Normocephalic and atraumatic. Eyes:   No icterus.   Conjunctiva pink. PERRLA. Ears:  Normal auditory acuity. Neck:  Supple; no masses or thyroidomegaly Lungs: Respirations even and unlabored. Lungs clear to auscultation bilaterally.   No wheezes, crackles, or rhonchi.  Heart:  Regular rate and rhythm;  Without murmur, clicks, rubs or gallops Abdomen:  Soft, nondistended, nontender. Normal bowel sounds. No appreciable masses or hepatomegaly.  No rebound or guarding.  Rectal:  Not performed. Msk:  Symmetrical without gross deformities.    Extremities:  Without edema, cyanosis or clubbing. Neurologic:  Alert and oriented x3;  grossly normal neurologically. Skin:  Intact without significant lesions or rashes. Cervical Nodes:  No significant cervical adenopathy. Psych:  Alert and  cooperative. Normal affect.  LAB RESULTS:  Recent Labs  08/10/15 1730 08/12/15 1138  WBC 5.4 4.7  HGB 11.4* 11.1*  HCT 32.0* 32.0*  PLT 269 273   BMET  Recent Labs  08/10/15 1730 08/12/15 1138  NA 137 138  K 3.2* 3.1*  CL 106 106  CO2 24 23  GLUCOSE 109* 91  BUN 6 6  CREATININE 0.68 0.65  CALCIUM 9.3 9.0   LFT  Recent Labs  08/12/15 1138  PROT 7.3  ALBUMIN 3.5  AST 67*  ALT 50  ALKPHOS 99  BILITOT 2.3*   PT/INR No results for input(s): LABPROT, INR in the last 72 hours.  STUDIES: [place] Ob Limited  08/10/2015  CLINICAL DATA:  30 year old pregnant  female with right upper quadrant pelvic. EXAM: LIMITED OBSTETRIC ULTRASOUND FINDINGS: Number of Fetuses:  Single Heart Rate:  145 bpm Movement:  Seen Presentation: Cephalic Placental Location: Anterior. Previa: None Amniotic Fluid (Subjective): Within normal limits. Multiple venous lakes noted in the placenta. BPD:  4.5cm 19w  foldd MATERNAL FINDINGS: Cervix:  Appears closed. Uterus/Adnexae:  The ovaries are not visualized. IMPRESSION: Single live intrauterine pregnancy. No acute abnormality identified. This exam is performed on an emergent basis and does not comprehensively evaluate fetal size, dating, or anatomy; follow-up complete OB US should be considered if further fetal assessment is warranted. Electronically Signed   By: Elgie CollardArash  Radparvar M.D.   On: 08/10/2015 21:38   [place] Abdomen Limited Ruq  08/12/2015  CLINICAL DATA:  Right upper quadrant abdominal pain. Symptoms for 1 week EXAM: US ABDOMEN LIMITED - RIGHT UPPER QUADRANT COMPARISON:  08/10/2015 FINDINGS: Gallbladder: Re- demonstrated sludge and gallstones. There is no wall thickening or focal tenderness suggestive of acute cholecystitis. Common bile duct: Diameter: 9.5 mm, increased from prior. The distal CBD is obscured by bowel gas but there is suspicious echogenic shadowing structure at the lower margin of the visible CBD. New intrahepatic bile duct dilatation at the hilum. Liver: No focal lesion identified. Within normal limits in parenchymal echogenicity. IMPRESSION: 1. Progressed bile duct dilatation compared to 2 days ago. High likelihood of choledocholithiasis at the distal CBD, but neighboring artifact from bowel gas. 2. Cholelithiasis. Electronically Signed   By: Marnee SpringJonathon  Watts M.D.   On: 08/12/2015 13:44      Impression / Plan:   Robin Haffaris [name] is a 30 y.o. y/o female with a dilated common bile duct and elevated bilirubin.  The patient has been told about doing an ERCP.  She has also been told the risks including pancreatitis which can  result in fetal death.  The patient agrees with proceeding with the procedure after being explained the risks and benefits.  Thank you for involving me in the care of this patient.        Robin Miniumarren Renette Hsu, MD  08/12/2015, 8:53 PM   Note: This dictation was prepared with Dragon dictation along with smaller phrase technology. Any transcriptional errors that result from this process are unintentional.

## 2015-08-12 NOTE — H&P (Addendum)
OBSTETRIC ANTENATAL ADMISSION HISTORY AND PHYSICAL NOTE  Obstetric History and Physical  Attending Provider: Conard NovakJackson, Stephen D, MD   Robin Haffaris Domanski [id number] 08/12/2015 6:22 PM    Chief Complaint:   Robin Davenport is a 30 y.o. G6P5005 at 4642w0d gestational age by 11 week ultrasound who presents for persistent right upper quadrant abdominal pain. Marland Kitchen.    History of Present Ilness:   Patient states she's had the pain for over a week. She has been the emergency department multiple times for this. She was seen as recently as three days ago and told to avoid fatty foods and provided with medications to assist with symptoms for her biliary colic. She has never had evidence of cholecystitis on her imaging or labs. Patient returned to the emergency department today complaining of increased right upper quadrant pain. Patient denies any fevers, chills, diarrhea, constipation, vaginal discharge. She's also had some nausea and vomiting but has not taken any medications. She notes +FM, no leakage of fluid, vaginal bleeding or contractions.    Past Medical History  Diagnosis Date  . Anemia   . Diabetes mellitus without complication [facility](HCC)     patient states was in pregnancy only   Past Surgical History  Procedure Laterality Date  . Cesarean section  06/26/2014    Procedure: CESAREAN SECTION;  Surgeon: [place] Bingharlie Pickens, MD;  Location: [facility] ORS;  Service: Obstetrics;;   Allergies:No Known Allergies   Prior to Admission medications   Medication Sig Start Date End Date Taking? Authorizing Provider  acetaminophen (TYLENOL) 325 MG tablet Take 650 mg by mouth every 6 (six) hours as needed for mild pain or headache.   Yes Historical Provider, MD   Obstetric History: She is a 696P5005 female s/p SVD x 4 with LTCS with G5. G5 complicated by GDM.    Social History:  She  reports that she has never smoked. She has never used smokeless tobacco. She reports that she does not drink alcohol.  Family History:  Reviewed  and none pertinent to clinical presentation today.   Review of Systems:  Negative x 10 systems reviewed except as noted in the HPI.    Objective    BP 107/72 mmHg  Pulse 62  Temp(Src) 98.7 F (37.1 C) (Oral)  Resp 20  Ht 5\' 4"  (1.626 m)  Wt 250 lb (113.399 kg)  BMI 42.89 kg/m2  SpO2 99%  LMP  (LMP Unknown) Physical Exam  General:  She is a well appearing female in no distress.  HEENT:  Normocephalic, atraumatic.   Neck:  supple, no lymphadenopathy Cardiac:  RRR Pulmonary:  Clear to auscultation bilaterally. No wheezes, rales, rhonchi.   Abdomen:  Soft, non-tender, non-distended, +BS  Pelvic:  deferred Extremities:  Non-tender, symmetric no edema bilaterally.   Neurologic:  Alert & oriented x 3.  Appropriate, conversant.    Laboratory Results:   Lab Results  Component Value Date   WBC 4.7 08/12/2015   RBC 3.54* 08/12/2015   HGB 11.1* 08/12/2015   HCT 32.0* 08/12/2015   PLT 273 08/12/2015   NA 138 08/12/2015   K 3.1* 08/12/2015   CREATININE 0.65 08/12/2015   Recent Labs     08/10/15  1730  08/12/15  1138  LIPASE  24  22  AST  19  67*  ALT  11*  50  ALKPHOS  65  99  BILITOT  0.5  2.3*      Lab Results  Component Value Date   PREGTESTUR POSITIVE* 08/12/2015  Imaging Results:  [place] Abdomen Limited Ruq  08/12/2015  CLINICAL DATA:  Right upper quadrant abdominal pain. Symptoms for 1 week EXAM: US ABDOMEN LIMITED - RIGHT UPPER QUADRANT COMPARISON:  08/10/2015 FINDINGS: Gallbladder: Re- demonstrated sludge and gallstones. There is no wall thickening or focal tenderness suggestive of acute cholecystitis. Common bile duct: Diameter: 9.5 mm, increased from prior. The distal CBD is obscured by bowel gas but there is suspicious echogenic shadowing structure at the lower margin of the visible CBD. New intrahepatic bile duct dilatation at the hilum. Liver: No focal lesion identified. Within normal limits in parenchymal echogenicity. IMPRESSION: 1. Progressed bile duct  dilatation compared to 2 days ago. High likelihood of choledocholithiasis at the distal CBD, but neighboring artifact from bowel gas. 2. Cholelithiasis. Electronically Signed   By: Marnee SpringJonathon  Watts M.D.   On: 08/12/2015 13:44      Assessment & Plan   Robin Davenport is a 30 y.o. G6P5005 at 403w0d gestation with no prenatal care so far with what appears to be choledocholithiasis.  She has been seen by general surgery (Dr. Tonita CongWoodham), who states that a cholecystectomy is not indicated at this time.  However, does recommend an ERCP. When talking with the ER physician today, I was told that GI had been contacted (Dr. Servando SnareWohl) and an ERCP would be planned.   Plan:  1.  Choledocholithiasis: as the patient is pregnant, I will admit her to my service for obstetrical oversight to her care while in-house.  Will formally consult GI to assess appropriateness of ERCP.  Discussed this briefly with patient.  Will hold fatty foods at this time and make NPO at midnight tonight, if she is to have ERCP tomorrow.  2.  Hypokalemia: will replete. 3.  Anemia: likely physiologic of pregnany. Will start on iron supplementation. 4.  History of gestational diabetes. Will monitor blood glucose while in the hospital. 5.  Insufficient (absent) prenatal care.  Patient states that she now has pregnancy medicaid and [facility] 6.  Pregnancy:  Will give prenatal vitamins while in-house and do daily doppler tones of fetus.  Will encourage routine prenatal care once discharged.  Conard NovakJackson, Stephen D, MD 08/12/2015 6:22 PM

## 2015-08-13 ENCOUNTER — Encounter
Admission: EM | Disposition: A | Discharge status: Home / Self Care | Payer: Self-pay | Source: Home / Self Care | Attending: Emergency Medicine

## 2015-08-13 ENCOUNTER — Encounter: Payer: Self-pay | Admitting: *Deleted

## 2015-08-13 ENCOUNTER — Observation Stay: Payer: Medicaid Other | Admitting: Registered Nurse

## 2015-08-13 DIAGNOSIS — K838 Other specified diseases of biliary tract: Secondary | ICD-10-CM | POA: Insufficient documentation

## 2015-08-13 DIAGNOSIS — K839 Disease of biliary tract, unspecified: Secondary | ICD-10-CM | POA: Insufficient documentation

## 2015-08-13 DIAGNOSIS — K8050 Calculus of bile duct without cholangitis or cholecystitis without obstruction: Secondary | ICD-10-CM | POA: Diagnosis not present

## 2015-08-13 HISTORY — PX: ERCP: SHX60

## 2015-08-13 HISTORY — PX: ERCP: SHX5425

## 2015-08-13 LAB — COMPREHENSIVE METABOLIC PANEL
ALBUMIN: 3 g/dL — AB (ref 3.5–5.0)
ALK PHOS: 87 U/L (ref 38–126)
ALT: 67 U/L — AB (ref 14–54)
AST: 69 U/L — ABNORMAL HIGH (ref 15–41)
Anion gap: 6 (ref 5–15)
BUN: <5 mg/dL — ABNORMAL LOW (ref 6–20)
CALCIUM: 8.4 mg/dL — AB (ref 8.9–10.3)
CHLORIDE: 108 mmol/L (ref 101–111)
CO2: 23 mmol/L (ref 22–32)
CREATININE: 0.74 mg/dL (ref 0.44–1.00)
GFR calc Af Amer: >60 mL/min (ref >60)
GFR calc non Af Amer: >60 mL/min (ref >60)
GLUCOSE: 84 mg/dL (ref 65–99)
Potassium: 3.5 mmol/L (ref 3.5–5.1)
SODIUM: 137 mmol/L (ref 135–145)
Total Bilirubin: 2.1 mg/dL — ABNORMAL HIGH (ref 0.3–1.2)
Total Protein: 6.3 g/dL — ABNORMAL LOW (ref 6.5–8.1)

## 2015-08-13 LAB — CBC
HCT: 29.2 % — ABNORMAL LOW (ref 35.0–47.0)
HEMOGLOBIN: 10.1 g/dL — AB (ref 12.0–16.0)
MCH: 31.8 pg (ref 26.0–34.0)
MCHC: 34.6 g/dL (ref 32.0–36.0)
MCV: 91.9 fL (ref 80.0–100.0)
PLATELETS: 228 10*3/uL (ref 150–440)
RBC: 3.18 MIL/uL — AB (ref 3.80–5.20)
RDW: 13.7 % (ref 11.5–14.5)
WBC: 4.1 10*3/uL (ref 3.6–11.0)

## 2015-08-13 LAB — GLUCOSE, CAPILLARY
GLUCOSE-CAPILLARY: 70 mg/dL (ref 65–99)
Glucose-Capillary: 72 mg/dL (ref 65–99)

## 2015-08-13 SURGERY — ERCP, WITH INTERVENTION IF INDICATED
Anesthesia: General

## 2015-08-13 MED ORDER — PROPOFOL 10 MG/ML IV BOLUS
INTRAVENOUS | Status: DC | PRN
  Administered 2015-08-13: 50 mg via INTRAVENOUS
  Administered 2015-08-13: 200 mg via INTRAVENOUS

## 2015-08-13 MED ORDER — FENTANYL CITRATE (PF) 100 MCG/2ML IJ SOLN
INTRAMUSCULAR | Status: DC | PRN
  Administered 2015-08-13: 100 ug via INTRAVENOUS

## 2015-08-13 MED ORDER — SUCCINYLCHOLINE CHLORIDE 20 MG/ML IJ SOLN
INTRAMUSCULAR | Status: DC | PRN
  Administered 2015-08-13: 120 mg via INTRAVENOUS

## 2015-08-13 MED ORDER — DEXTROSE-NACL 5-0.9 % IV SOLN
INTRAVENOUS | Status: DC
  Administered 2015-08-13: 16:00:00 via INTRAVENOUS

## 2015-08-13 MED ORDER — PHENYLEPHRINE HCL 10 MG/ML IJ SOLN
INTRAMUSCULAR | Status: DC | PRN
  Administered 2015-08-13 (×3): 100 ug via INTRAVENOUS

## 2015-08-13 MED ORDER — LIDOCAINE HCL (CARDIAC) 20 MG/ML IV SOLN
INTRAVENOUS | Status: DC | PRN
  Administered 2015-08-13: 100 mg via INTRAVENOUS

## 2015-08-13 NOTE — Progress Notes (Signed)
Dr. Randa NgoPiscitello requested FHT before ERCP to be done----FHT 148-162 with good variablility

## 2015-08-13 NOTE — Anesthesia Procedure Notes (Signed)
Procedure Name: Intubation Date/Time: 08/13/2015 4:10 PM Performed by: Robin Davenport, Robin Davenport Pre-anesthesia Checklist: Patient identified, Emergency Drugs available, Suction available and Patient being monitored Patient Re-evaluated:Patient Re-evaluated prior to inductionOxygen Delivery Method: Circle system utilized Preoxygenation: Pre-oxygenation with 100% oxygen Intubation Type: IV induction Laryngoscope Size: McGraph and 4 Grade View: Grade I Tube type: Oral Tube size: 7.0 mm Number of attempts: 1 Airway Equipment and Method: Stylet Placement Confirmation: ETT inserted through vocal cords under direct vision,  positive ETCO2 and breath sounds checked- equal and bilateral Secured at: 22 cm Tube secured with: Tape Dental Injury: Teeth and Oropharynx as per pre-operative assessment

## 2015-08-13 NOTE — Anesthesia Preprocedure Evaluation (Addendum)
Anesthesia Evaluation  Patient identified by MRN, date of birth, ID band Patient awake    Reviewed: Allergy & Precautions, H&P , NPO status , Patient's Chart, lab work & pertinent test results  History of Anesthesia Complications Negative for: history of anesthetic complications  Airway Mallampati: III  TM Distance: >3 FB Neck ROM: full    Dental  (+) Poor Dentition, Chipped   Pulmonary neg pulmonary ROS, neg shortness of breath,    Pulmonary exam normal breath sounds clear to auscultation       Cardiovascular Exercise Tolerance: Good (-) angina(-) Past MI and (-) DOE negative cardio ROS Normal cardiovascular exam Rhythm:regular Rate:Normal     Neuro/Psych negative neurological ROS  negative psych ROS   GI/Hepatic negative GI ROS, Neg liver ROS,   Endo/Other  diabetes, Type 2  Renal/GU negative Renal ROS  negative genitourinary   Musculoskeletal   Abdominal   Peds  Hematology negative hematology ROS (+)   Anesthesia Other Findings Past Medical History:   Anemia                                                       Diabetes mellitus without complication (HCC)                   Comment:patient states was in pregnancy only  Past Surgical History:   CESAREAN SECTION                                 06/26/2014      Comment:Procedure: CESAREAN SECTION;  Surgeon: Kimberly Bingharlie               Pickens, MD;  Location: [facility] ORS;  Service:               Obstetrics;;  BMI    Body Mass Index   39.80 kg/m 2      Reproductive/Obstetrics (+) Pregnancy                            Anesthesia Physical Anesthesia Plan  ASA: III  Anesthesia Plan: General ETT   Post-op Pain Management:    Induction:   Airway Management Planned:   Additional Equipment:   Intra-op Plan:   Post-operative Plan:   Informed Consent: I have reviewed the patients History and Physical, chart, labs and discussed the  procedure including the risks, benefits and alternatives for the proposed anesthesia with the patient or authorized representative who has indicated his/her understanding and acceptance.   Dental Advisory Given  Plan Discussed with: Anesthesiologist, CRNA and Surgeon  Anesthesia Plan Comments: (Patient is [redacted] weeks pregnant, risks and benefits of anesthesia discussed with patient.  Patient voiced understanding. Plan for FHR before and after procedure)       Anesthesia Quick Evaluation

## 2015-08-13 NOTE — Op Note (Signed)
[facility] Gastroenterology Patient Name: Robin Davenport Procedure Date: 08/13/2015 2:36 PM MRN: [id number] Account #: [id number] Date of Birth: 07/04/1985 Admit Type: Outpatient Age: [age] [facility] Gender: Female Note Status: Finalized Procedure:            ERCP Indications:          Biliary dilation on Ultrasound, Suspected bile duct                        stone(s), Patient is [redacted] weeks pregnant Providers:            [name] Jenean Escandon, MD Referring MD:         No Local Md, MD (Referring MD) Medicines:            General Anesthesia Complications:        No immediate complications. Procedure:            Pre-Anesthesia Assessment:                       - Prior to the procedure, a History and Physical was                        performed, and patient medications and allergies were                        reviewed. The patient's tolerance of previous                        anesthesia was also reviewed. The risks and benefits of                        the procedure and the sedation options and risks were                        discussed with the patient. All questions were                        answered, and informed consent was obtained. Prior                        Anticoagulants: The patient has taken no previous                        anticoagulant or antiplatelet agents. ASA Grade                        Assessment: II - A patient with mild systemic disease.                        After reviewing the risks and benefits, the patient was                        deemed in satisfactory condition to undergo the                        procedure.                       After obtaining informed consent, the scope was passed  under direct vision. Throughout the procedure, the                        patient's blood pressure, pulse, and oxygen saturations                        were monitored continuously. The Endosonoscope was    introduced through the mouth, and used to inject                        contrast into and used to cannulate the bile duct. The                        ERCP was accomplished without difficulty. The patient                        tolerated the procedure well. Findings:      The scout film was normal. The major papilla was bulging. The bile duct       was deeply cannulated. Contrast was injected. I personally interpreted       the bile duct images. There was brisk flow of contrast through the       ducts. Image quality was excellent. Contrast extended to the entire       biliary tree. The main bile duct was diffusely dilated, acquired. The       largest diameter was 9 mm. A wire was passed into the biliary tree.       Biliary sphincterotomy was made with a traction (standard)       sphincterotome using ERBE electrocautery. There was no       post-sphincterotomy bleeding. The biliary tree was swept with a 15 mm       balloon starting at the bifurcation. One stone was removed. No stones       remained. Impression:           - The major papilla appeared to be bulging.                       - The entire main bile duct was dilated, acquired.                       - Choledocholithiasis was found. Complete removal was                        accomplished by biliary sphincterotomy and balloon                        extraction.                       - A biliary sphincterotomy was performed.                       - The biliary tree was swept. Recommendation:       - Watch for pancreatitis, bleeding, perforation, and                        cholangitis. Procedure Code(s):    --- Professional ---                       [phone], Endoscopic retrograde cholangiopancreatography                        (  ERCP); with removal of calculi/debris from                        biliary/pancreatic duct(s)                       43262, Endoscopic retrograde cholangiopancreatography                        (ERCP); with  sphincterotomy/papillotomy                       [phone] [id number], Endoscopic catheterization of the biliary ductal                        system, radiological supervision and interpretation Diagnosis Code(s):    --- Professional ---                       K80.50, Calculus of bile duct without cholangitis or                        cholecystitis without obstruction                       K83.9, Disease of biliary tract, unspecified                       K83.8, Other specified diseases of biliary tract CPT copyright 2016 American Medical Association. All rights reserved. The codes documented in this report are preliminary and upon coder review may  be revised to meet current compliance requirements. [name] Jaden Batchelder, MD 08/13/2015 4:52:57 PM This report has been signed electronically. Number of Addenda: 0 Note Initiated On: 08/13/2015 2:36 PM      [facility]

## 2015-08-13 NOTE — Anesthesia Postprocedure Evaluation (Signed)
Anesthesia Post Note  Patient: Robin Davenport  Procedure(s) Performed: Procedure(s) (LRB): ENDOSCOPIC RETROGRADE CHOLANGIOPANCREATOGRAPHY (ERCP) (N/A)  Patient location during evaluation: PACU Anesthesia Type: General Level of consciousness: awake and alert Pain management: pain level controlled Vital Signs Assessment: post-procedure vital signs reviewed and stable Respiratory status: spontaneous breathing, nonlabored ventilation, respiratory function stable and patient connected to nasal cannula oxygen Cardiovascular status: blood pressure returned to baseline and stable Postop Assessment: no signs of nausea or vomiting Anesthetic complications: no Comments: FHR 140-160 preOp and 147 post op    Last Vitals:  Filed Vitals:   08/13/15 1736 08/13/15 1820  BP: 116/73 113/66  Pulse: 71 73  Temp: 37.2 C 37.3 C  Resp: 19 18    Last Pain:  Filed Vitals:   08/13/15 1821  PainSc: 9                  Joseph K Piscitello

## 2015-08-13 NOTE — Transfer of Care (Signed)
Immediate Anesthesia Transfer of Care Note  Patient: Robin Davenport  Procedure(s) Performed: Procedure(s): ENDOSCOPIC RETROGRADE CHOLANGIOPANCREATOGRAPHY (ERCP) (N/A)  Patient Location: PACU  Anesthesia Type:General  Level of Consciousness: awake  Airway & Oxygen Therapy: Patient connected to face mask oxygen  Post-op Assessment: Post -op Vital signs reviewed and stable  Post vital signs: stable  Last Vitals:  Filed Vitals:   08/13/15 1513 08/13/15 1658  BP: 111/62 120/72  Pulse: 66 100  Temp: 37 C 36.7 C  Resp: 16 19    Last Pain:  Filed Vitals:   08/13/15 1700  PainSc: 8          Complications: No apparent anesthesia complications

## 2015-08-13 NOTE — Interval H&P Note (Signed)
ER was recommended that this patient may be better served at a major medical center. They reported that OB reported that they are willing to take care of this high risk patient. The patient states she understands that post ERCP pancreatitis can cause fetal termination. History and Physical Interval Note:  08/13/2015 4:04 PM  Robin Davenport  has presented today for surgery, with the diagnosis of CBD stone  The various methods of treatment have been discussed with the patient and family. After consideration of risks, benefits and other options for treatment, the patient has consented to  Procedure(s): ENDOSCOPIC RETROGRADE CHOLANGIOPANCREATOGRAPHY (ERCP) (N/A) as a surgical intervention .  The patient's history has been reviewed, patient examined, no change in status, stable for surgery.  I have reviewed the patient's chart and labs.  Questions were answered to the patient's satisfaction.     Byrd Rushlow [employer]

## 2015-08-13 NOTE — H&P (View-Only) (Signed)
Robin Miniumarren [name], MD [facility]  [address] [place], [place] [address] Phone: [phone] Fax : [phone]  Consultation  Referring Provider:     No ref. provider found Primary Care Physician:  Pcp Not In System Primary Gastroenterologist:  None         Reason for Consultation:     Dilated common bile duct  Date of Admission:  08/12/2015 Date of Consultation:  08/12/2015         HPI:   Robin Davenport is a 30 y.o. female who comes in with biliary colic and a ultrasound that showed a dilated common bile duct. The ultrasound report was suggestive of a common bile duct stone.The patient is presently pregnant with her 6th child. The patient's bilirubin has increased from 2 days ago.  It was 0.5 but now it is 2.3.  Her AST has also increased to 67 from 19. I'm now being asked to see the patient for possible ERCP.  Past Medical History  Diagnosis Date  . Anemia   . Diabetes mellitus without complication [facility](HCC)     patient states was in pregnancy only    Past Surgical History  Procedure Laterality Date  . Cesarean section  06/26/2014    Procedure: CESAREAN SECTION;  Surgeon: E[place] Bingharlie Pickens, MD;  Location: [facility] ORS;  Service: Obstetrics;;    Prior to Admission medications   Medication Sig Start Date End Date Taking? Authorizing Provider  acetaminophen (TYLENOL) 325 MG tablet Take 650 mg by mouth every 6 (six) hours as needed for mild pain or headache.   Yes Historical Provider, MD  metoCLOPramide (REGLAN) 10 MG tablet Take 1 tablet (10 mg total) by mouth every 6 (six) hours as needed. Patient not taking: Reported on 08/12/2015 08/10/15   Myrna Blazeravid Matthew Schaevitz, MD  Prenatal Multivit-Min-Fe-FA (PRENATAL VITAMINS) 0.8 MG tablet Take 1 tablet by mouth daily. Patient not taking: Reported on 08/12/2015 08/10/15   Myrna Blazeravid Matthew Schaevitz, MD    No family history on file.   Social History  Substance Use Topics  . Smoking status: Never Smoker   . Smokeless tobacco: Never Used  . Alcohol  Use: No    Allergies as of 08/12/2015  . (No Known Allergies)    Review of Systems:    All systems reviewed and negative except where noted in HPI.   Physical Exam:  Vital signs in last 24 hours: Temp:  [98.6 F (37 C)-98.7 F (37.1 C)] 98.6 F (37 C) (06/26 1927) Pulse Rate:  [54-73] 58 (06/26 1927) Resp:  [18-20] 18 (06/26 1927) BP: (96-126)/(50-72) 126/66 mmHg (06/26 1927) SpO2:  [97 %-100 %] 100 % (06/26 1927) Weight:  [250 lb (113.399 kg)] 250 lb (113.399 kg) (06/26 1155)   General:   Pleasant, cooperative in NAD Head:  Normocephalic and atraumatic. Eyes:   No icterus.   Conjunctiva pink. PERRLA. Ears:  Normal auditory acuity. Neck:  Supple; no masses or thyroidomegaly Lungs: Respirations even and unlabored. Lungs clear to auscultation bilaterally.   No wheezes, crackles, or rhonchi.  Heart:  Regular rate and rhythm;  Without murmur, clicks, rubs or gallops Abdomen:  Soft, nondistended, nontender. Normal bowel sounds. No appreciable masses or hepatomegaly.  No rebound or guarding.  Rectal:  Not performed. Msk:  Symmetrical without gross deformities.    Extremities:  Without edema, cyanosis or clubbing. Neurologic:  Alert and oriented x3;  grossly normal neurologically. Skin:  Intact without significant lesions or rashes. Cervical Nodes:  No significant cervical adenopathy. Psych:  Alert and  cooperative. Normal affect.  LAB RESULTS:  Recent Labs  08/10/15 1730 08/12/15 1138  WBC 5.4 4.7  HGB 11.4* 11.1*  HCT 32.0* 32.0*  PLT 269 273   BMET  Recent Labs  08/10/15 1730 08/12/15 1138  NA 137 138  K 3.2* 3.1*  CL 106 106  CO2 24 23  GLUCOSE 109* 91  BUN 6 6  CREATININE 0.68 0.65  CALCIUM 9.3 9.0   LFT  Recent Labs  08/12/15 1138  PROT 7.3  ALBUMIN 3.5  AST 67*  ALT 50  ALKPHOS 99  BILITOT 2.3*   PT/INR No results for input(s): LABPROT, INR in the last 72 hours.  STUDIES: Us Ob Limited  08/10/2015  CLINICAL DATA:  29-year-old pregnant  female with right upper quadrant pelvic. EXAM: LIMITED OBSTETRIC ULTRASOUND FINDINGS: Number of Fetuses:  Single Heart Rate:  145 bpm Movement:  Seen Presentation: Cephalic Placental Location: Anterior. Previa: None Amniotic Fluid (Subjective): Within normal limits. Multiple venous lakes noted in the placenta. BPD:  4.5cm 19w  foldd MATERNAL FINDINGS: Cervix:  Appears closed. Uterus/Adnexae:  The ovaries are not visualized. IMPRESSION: Single live intrauterine pregnancy. No acute abnormality identified. This exam is performed on an emergent basis and does not comprehensively evaluate fetal size, dating, or anatomy; follow-up complete OB US should be considered if further fetal assessment is warranted. Electronically Signed   By: Arash  Radparvar M.D.   On: 08/10/2015 21:38   Us Abdomen Limited Ruq  08/12/2015  CLINICAL DATA:  Right upper quadrant abdominal pain. Symptoms for 1 week EXAM: US ABDOMEN LIMITED - RIGHT UPPER QUADRANT COMPARISON:  08/10/2015 FINDINGS: Gallbladder: Re- demonstrated sludge and gallstones. There is no wall thickening or focal tenderness suggestive of acute cholecystitis. Common bile duct: Diameter: 9.5 mm, increased from prior. The distal CBD is obscured by bowel gas but there is suspicious echogenic shadowing structure at the lower margin of the visible CBD. New intrahepatic bile duct dilatation at the hilum. Liver: No focal lesion identified. Within normal limits in parenchymal echogenicity. IMPRESSION: 1. Progressed bile duct dilatation compared to 2 days ago. High likelihood of choledocholithiasis at the distal CBD, but neighboring artifact from bowel gas. 2. Cholelithiasis. Electronically Signed   By: Jonathon  Watts M.D.   On: 08/12/2015 13:44      Impression / Plan:   Robin Davenport is a 29 y.o. y/o female with a dilated common bile duct and elevated bilirubin.  The patient has been told about doing an ERCP.  She has also been told the risks including pancreatitis which can  result in fetal death.  The patient agrees with proceeding with the procedure after being explained the risks and benefits.  Thank you for involving me in the care of this patient.        Khala Tarte, MD  08/12/2015, 8:53 PM   Note: This dictation was prepared with Dragon dictation along with smaller phrase technology. Any transcriptional errors that result from this process are unintentional.    

## 2015-08-13 NOTE — Progress Notes (Signed)
Benign Gynecology Progress Note  Admission Date: 08/12/2015 Current Date: 08/13/2015  Robin Davenport is a 30 y.o. G6P5001 HD#1 @ 5432w1d with abdominal pain and gall bladder concerns.   History complicated by: Patient Active Problem List   Diagnosis Date Noted  . Choledocholithiasis 08/12/2015  . Supervision of high risk pregnancy in second trimester 08/12/2015  . [redacted] weeks gestation of pregnancy 08/12/2015  . History of cesarean delivery affecting pregnancy 08/12/2015  . Obesity affecting pregnancy in second trimester 08/12/2015  . BMI 40.0-44.9, adult (HCC) 08/12/2015  . Grand multipara in labor in second trimester 08/12/2015  . Limited prenatal care in second trimester 08/12/2015   ROS and patient/family/surgical history, located on admission H&P note dated 08/12/2015, have been reviewed, and there are no changes except as noted below  Yesterday/Overnight Events:  Pain improved after pain medicine given. Plans ERCP this afternoon.   Subjective:  Pain is RUQ and R upper back; improved w pain meds. NPO, no nausea this am Ambulates well and is taking shower this am.   Objective:  Temp:  [98.1 F (36.7 C)-98.7 F (37.1 C)] 98.2 F (36.8 C) (06/27 0438) Pulse Rate:  [53-73] 64 (06/27 0438) Resp:  [18-20] 18 (06/27 0438) BP: (96-133)/(41-72) 110/41 mmHg (06/27 0438) SpO2:  [97 %-100 %] 100 % (06/27 0438) Weight:  [113.399 kg (250 lb)] 113.399 kg (250 lb) (06/26 1155) I/O last 3 completed shifts: In: -  Out: 200 [Urine:200]   Physical exam: General appearance: alert, cooperative and appears stated age Abdomen: soft, non-tender; bowel sounds normal; no masses,  no organomegaly, mild RUQ T to deep palpation Lungs: clear to auscultation bilaterally Heart: regular rate and rhythm and no MRGs Extremities: no redness or tenderness in the calves or thighs, no edema Skin: no lesions Psych: appropriate   Recent Labs Lab 08/10/15 1730 08/12/15 1138 08/13/15 0433  NA 137 138 137   K 3.2* 3.1* 3.5  CL 106 106 108  CO2 24 23 23   BUN 6 6 <5*  CREATININE 0.68 0.65 0.74  GLUCOSE 109* 91 84    Recent Labs Lab 08/10/15 1730 08/12/15 1138 08/13/15 0433  WBC 5.4 4.7 4.1  HGB 11.4* 11.1* 10.1*  HCT 32.0* 32.0* 29.2*  PLT 269 273 228     Recent Labs Lab 08/10/15 1730 08/12/15 1138 08/13/15 0433  CALCIUM 9.3 9.0 8.4*   No results for input(s): INR, APTT in the last 168 hours.     Recent Labs Lab 08/10/15 1730 08/12/15 1138 08/13/15 0433  ALKPHOS 65 99 87  BILITOT 0.5 2.3* 2.1*  PROT 7.1 7.3 6.3*  ALT 11* 50 67*  AST 19 67* 69*   Assessment & Plan:  [redacted] weeks EGA, choledocolithiasis  *GYN/OB: FHT daily.  Risks of procedure as well as not doing procedure on pregnancy discussed. *Pain:PRN *FEN/GI:See GI, appreciate consult *Dispo:Home when stable and tol diet after ERCP  Annamarie MajorPaul Jalaya Sarver, MD [phone]

## 2015-08-13 NOTE — Progress Notes (Signed)
CC: Abdominal pain Subjective: Patient reports that her abdominal pain is mostly resolved since admission. Still has some tenderness when palpated. She reports she has had bowel function. She has been nothing by mouth for her anticipated GI procedure later today.  Objective: Vital signs in last 24 hours: Temp:  [98 F (36.7 C)-99.4 F (37.4 C)] 98 F (36.7 C) (06/27 1117) Pulse Rate:  [53-73] 66 (06/27 1117) Resp:  [18-20] 18 (06/27 1117) BP: (95-133)/(41-72) 108/59 mmHg (06/27 1117) SpO2:  [97 %-100 %] 100 % (06/27 0850)    Intake/Output from previous day: 06/26 0701 - 06/27 0700 In: -  Out: 200 [Urine:200] Intake/Output this shift:    Physical exam:  Gen.: No acute distress Chest: Clear to auscultation Heart: Regular rate and rhythm Abdomen: Soft, minimally tender to palpation in the right upper quadrant on deep palpation, gravid abdomen  Lab Results: CBC   Recent Labs  08/12/15 1138 08/13/15 0433  WBC 4.7 4.1  HGB 11.1* 10.1*  HCT 32.0* 29.2*  PLT 273 228   BMET  Recent Labs  08/12/15 1138 08/13/15 0433  NA 138 137  K 3.1* 3.5  CL 106 108  CO2 23 23  GLUCOSE 91 84  BUN 6 <5*  CREATININE 0.65 0.74  CALCIUM 9.0 8.4*   PT/INR No results for input(s): LABPROT, INR in the last 72 hours. ABG No results for input(s): PHART, HCO3 in the last 72 hours.  Invalid input(s): PCO2, PO2  Studies/Results: [place] Abdomen Limited Ruq  08/12/2015  CLINICAL DATA:  Right upper quadrant abdominal pain. Symptoms for 1 week EXAM: US ABDOMEN LIMITED - RIGHT UPPER QUADRANT COMPARISON:  08/10/2015 FINDINGS: Gallbladder: Re- demonstrated sludge and gallstones. There is no wall thickening or focal tenderness suggestive of acute cholecystitis. Common bile duct: Diameter: 9.5 mm, increased from prior. The distal CBD is obscured by bowel gas but there is suspicious echogenic shadowing structure at the lower margin of the visible CBD. New intrahepatic bile duct dilatation at the  hilum. Liver: No focal lesion identified. Within normal limits in parenchymal echogenicity. IMPRESSION: 1. Progressed bile duct dilatation compared to 2 days ago. High likelihood of choledocholithiasis at the distal CBD, but neighboring artifact from bowel gas. 2. Cholelithiasis. Electronically Signed   By: Marnee SpringJonathon  Watts M.D.   On: 08/12/2015 13:44    Anti-infectives: Anti-infectives    None      Assessment/Plan:  30 year old female with likely choledocholithiasis. No imaging or laboratory findings concerning for cholecystitis. Discussed with the patient the only indication for general surgery intervention during pregnancy would be at this point if she fails to improve after ERCP. Assuming she gets the response that is anticipated we would attempt to postpone surgery until after delivery of the child. No plans for surgical intervention at this time.  Winifred Bodiford T. Tonita CongWoodham, MD, FACS  08/13/2015

## 2015-08-14 ENCOUNTER — Encounter: Payer: Self-pay | Admitting: Gastroenterology

## 2015-08-14 LAB — HEPATIC FUNCTION PANEL
ALBUMIN: 2.9 g/dL — AB (ref 3.5–5.0)
ALK PHOS: 82 U/L (ref 38–126)
ALT: 74 U/L — AB (ref 14–54)
AST: 52 U/L — AB (ref 15–41)
BILIRUBIN TOTAL: 0.9 mg/dL (ref 0.3–1.2)
Bilirubin, Direct: 0.4 mg/dL (ref 0.1–0.5)
Indirect Bilirubin: 0.5 mg/dL (ref 0.3–0.9)
TOTAL PROTEIN: 6.1 g/dL — AB (ref 6.5–8.1)

## 2015-08-14 LAB — LIPASE, BLOOD: LIPASE: 20 U/L (ref 11–51)

## 2015-08-14 MED ORDER — POLYETHYLENE GLYCOL 3350 17 G PO PACK
17.0000 g | PACK | Freq: Every day | ORAL | Status: DC
  Administered 2015-08-14: 17 g via ORAL
  Filled 2015-08-14: qty 1

## 2015-08-14 NOTE — Progress Notes (Signed)
Subjective: Pt is comfortable, she is tolerating PO intake, she is ambulating and voiding without difficulty. Her pain is controlled with PO medications. She has a complaint of no bowel movement x 1 week and denies flatus  Objective: Vital signs in last 24 hours: Temp:  [98.2 F (36.8 C)-98.7 F (37.1 C)] 98.7 F (37.1 C) (06/28 1912) Pulse Rate:  [52-80] 52 (06/28 1912) Resp:  [18-20] 20 (06/28 1912) BP: (99-115)/(45-61) 102/52 mmHg (06/28 1912) SpO2:  [98 %-100 %] 100 % (06/28 1912)  Intake/Output from previous day: 06/27 0701 - 06/28 0700 In: 1475 [I.V.:1475] Out: 3000 [Urine:3000]    Physical exam: General no acute distress  Chest clear to auscultation Heart regular rhythm Abdomen soft, gravid, + bowel sounds all 4 quadrants  Results for orders placed or performed during the hospital encounter of 08/12/15 (from the past 24 hour(s))  Hepatic function panel     Status: Abnormal   Collection Time: 08/14/15  7:12 AM  Result Value Ref Range   Total Protein 6.1 (L) 6.5 - 8.1 g/dL   Albumin 2.9 (L) 3.5 - 5.0 g/dL   AST 52 (H) 15 - 41 U/L   ALT 74 (H) 14 - 54 U/L   Alkaline Phosphatase 82 38 - 126 U/L   Total Bilirubin 0.9 0.3 - 1.2 mg/dL   Bilirubin, Direct 0.4 0.1 - 0.5 mg/dL   Indirect Bilirubin 0.5 0.3 - 0.9 mg/dL  Lipase, blood     Status: None   Collection Time: 08/14/15  7:12 AM  Result Value Ref Range   Lipase 20 11 - 51 U/L    Studies/Results: [place] Ob Limited  08/10/2015  CLINICAL DATA:  30 year old pregnant female with right upper quadrant pelvic. EXAM: LIMITED OBSTETRIC ULTRASOUND FINDINGS: Number of Fetuses:  Single Heart Rate:  145 bpm Movement:  Seen Presentation: Cephalic Placental Location: Anterior. Previa: None Amniotic Fluid (Subjective): Within normal limits. Multiple venous lakes noted in the placenta. BPD:  4.5cm 19w  foldd MATERNAL FINDINGS: Cervix:  Appears closed. Uterus/Adnexae:  The ovaries are not visualized. IMPRESSION: Single live intrauterine  pregnancy. No acute abnormality identified. This exam is performed on an emergent basis and does not comprehensively evaluate fetal size, dating, or anatomy; follow-up complete OB US should be considered if further fetal assessment is warranted. Electronically Signed   By: Elgie CollardArash  Radparvar M.D.   On: 08/10/2015 21:38   [place] Abdomen Limited Ruq  08/12/2015  CLINICAL DATA:  Right upper quadrant abdominal pain. Symptoms for 1 week EXAM: US ABDOMEN LIMITED - RIGHT UPPER QUADRANT COMPARISON:  08/10/2015 FINDINGS: Gallbladder: Re- demonstrated sludge and gallstones. There is no wall thickening or focal tenderness suggestive of acute cholecystitis. Common bile duct: Diameter: 9.5 mm, increased from prior. The distal CBD is obscured by bowel gas but there is suspicious echogenic shadowing structure at the lower margin of the visible CBD. New intrahepatic bile duct dilatation at the hilum. Liver: No focal lesion identified. Within normal limits in parenchymal echogenicity. IMPRESSION: 1. Progressed bile duct dilatation compared to 2 days ago. High likelihood of choledocholithiasis at the distal CBD, but neighboring artifact from bowel gas. 2. Cholelithiasis. Electronically Signed   By: Marnee SpringJonathon  Watts M.D.   On: 08/12/2015 13:44   [place] Abdomen Limited Ruq  08/10/2015  CLINICAL DATA:  Acute onset of right upper quadrant abdominal pain and pelvic pain. Initial encounter. EXAM: US ABDOMEN LIMITED - RIGHT UPPER QUADRANT COMPARISON:  None. FINDINGS: Gallbladder: Stones and sludge are noted within the gallbladder. Stones measure up to  8 mm in size. No gallbladder wall thickening or pericholecystic fluid is seen. The patient has been given pain medication, limiting evaluation for ultrasonographic Murphy's sign. Common bile duct: Diameter: 0.7 cm, borderline prominent, though this may reflect the patient's baseline. Liver: No focal lesion identified. Within normal limits in parenchymal echogenicity. IMPRESSION: Stones and sludge  noted within the gallbladder. No evidence for cholecystitis. Borderline prominent common bile duct may remain within normal limits, without definite evidence for obstruction. Electronically Signed   By: Roanna RaiderJeffery  Chang M.D.   On: 08/10/2015 21:08    Scheduled Meds: . polyethylene glycol  17 g Oral Daily  . prenatal multivitamin  1 tablet Oral Q1200   Continuous Infusions: . lactated ringers 125 mL/hr (08/14/15 0140)   PRN Meds:HYDROmorphone (DILAUDID) injection, promethazine  Assessment/Plan:  1. Miralax for bowel movement 2. Prenatal care as scheduled 3. Disposition: home tomorrow    Tresea MallGLEDHILL,Cindi Ghazarian, CNM

## 2015-08-14 NOTE — Progress Notes (Signed)
CC: Choledocholithiasis Subjective: Patient reports feeling better today than she did before her ERCP. Tolerating a diet without nausea or vomiting. Her pain is much better controlled.  Objective: Vital signs in last 24 hours: Temp:  [97.8 F (36.6 C)-99.2 F (37.3 C)] 98.7 F (37.1 C) (06/28 1117) Pulse Rate:  [60-80] 60 (06/28 1117) Resp:  [18-20] 18 (06/28 1117) BP: (99-115)/(45-66) 106/50 mmHg (06/28 1117) SpO2:  [98 %-100 %] 98 % (06/28 1117)    Intake/Output from previous day: 06/27 0701 - 06/28 0700 In: 1475 [I.V.:1475] Out: 3000 [Urine:3000] Intake/Output this shift:    Physical exam:  Gen.: No acute distress Chest: Clear to auscultation Heart: Regular rhythm Abdomen: Large, soft, nondistended, minimally tender to deep palpation in the right upper quadrant  Lab Results: CBC   Recent Labs  08/12/15 1138 08/13/15 0433  WBC 4.7 4.1  HGB 11.1* 10.1*  HCT 32.0* 29.2*  PLT 273 228   BMET  Recent Labs  08/12/15 1138 08/13/15 0433  NA 138 137  K 3.1* 3.5  CL 106 108  CO2 23 23  GLUCOSE 91 84  BUN 6 <5*  CREATININE 0.65 0.74  CALCIUM 9.0 8.4*   PT/INR No results for input(s): LABPROT, INR in the last 72 hours. ABG No results for input(s): PHART, HCO3 in the last 72 hours.  Invalid input(s): PCO2, PO2  Studies/Results: No results found.  Anti-infectives: Anti-infectives    None      Assessment/Plan:  30 year old female status post ERCP for choledocholithiasis via Dr. Servando SnareWohl of GI. Continues to have no evidence of cholecystitis. Discussed with the patient that it would be the safest to wait delivery of her child prior to proceeding with cholecystectomy. However, should she develop cholecystitis or have persistently symptomatic biliary colic that does not resolve with conservative measures it would be possible to proceed with a laparoscopic cholecystectomy. All questions were answered. No indications for surgical intervention at this time. She can  follow-up with the surgery clinic in 2-3 weeks to discuss cholecystectomy in the postpartum state. Please call again if any additional issues arise this admission.  Rapheal Masso T. Tonita CongWoodham, MD, FACS  08/14/2015

## 2015-08-15 MED ORDER — PROMETHAZINE HCL 12.5 MG PO TABS: 12.5000 mg | ORAL_TABLET | Freq: Four times a day (QID) | ORAL | Status: AC | PRN

## 2015-08-15 MED ORDER — OXYCODONE-ACETAMINOPHEN 5-325 MG PO TABS: 1.0000 | ORAL_TABLET | Freq: Four times a day (QID) | ORAL | Status: DC | PRN

## 2015-08-15 NOTE — Progress Notes (Signed)
Discharge instructions reviewed with patient.  All questions answered.  Follow up appointment scheduled.  

## 2015-08-15 NOTE — Discharge Summary (Signed)
Obstetric Discharge Summary Reason for Admission: persistant RUQ pain  In-patient summary: Pt admitted for above complaint. She is currently [redacted] weeks pregnant with no prenatal care, but 11 week [place] for dating. Underwent ERCP on 6/27 for biliary dilation on [place] and suspected bile duct stones. Since procedure pt had been feeling much less pain and in ready for discharge today on Post-procedure day 2. C/o mild sore throat and neck and leg stiffness likely r/t being in bed for several days. Pt denies pregnancy concerns such as bleeding, cramping or LOF. + FM.    Recent Results (from the past 2160 hour(s))  Comprehensive metabolic panel     Status: Abnormal   Collection Time: 06/03/15 10:25 AM  Result Value Ref Range   Sodium 134 (L) 135 - 145 mmol/L   Potassium 3.3 (L) 3.5 - 5.1 mmol/L   Chloride 107 101 - 111 mmol/L   CO2 23 22 - 32 mmol/L   Glucose, Bld 124 (H) 65 - 99 mg/dL   BUN 9 6 - 20 mg/dL   Creatinine, Ser 0.66 0.44 - 1.00 mg/dL   Calcium 9.1 8.9 - 10.3 mg/dL   Total Protein 7.0 6.5 - 8.1 g/dL   Albumin 3.7 3.5 - 5.0 g/dL   AST 15 15 - 41 U/L   ALT 12 (L) 14 - 54 U/L   Alkaline Phosphatase 42 38 - 126 U/L   Total Bilirubin 0.4 0.3 - 1.2 mg/dL   GFR calc non Af Amer >60 >60 mL/min   GFR calc Af Amer >60 >60 mL/min    Comment: (NOTE) The eGFR has been calculated using the CKD EPI equation. This calculation has not been validated in all clinical situations. eGFR's persistently <60 mL/min signify possible Chronic Kidney Disease.    Anion gap 4 (L) 5 - 15  CBC with Differential     Status: Abnormal   Collection Time: 06/03/15 10:25 AM  Result Value Ref Range   WBC 4.7 3.6 - 11.0 K/uL   RBC 3.72 (L) 3.80 - 5.20 MIL/uL   Hemoglobin 11.4 (L) 12.0 - 16.0 g/dL   HCT 33.8 (L) 35.0 - 47.0 %   MCV 91.0 80.0 - 100.0 fL   MCH 30.7 26.0 - 34.0 pg   MCHC 33.8 32.0 - 36.0 g/dL   RDW 14.2 11.5 - 14.5 %   Platelets 275 150 - 440 K/uL   Neutrophils Relative % 56 %   Neutro Abs 2.6 1.4 -  6.5 K/uL   Lymphocytes Relative 36 %   Lymphs Abs 1.7 1.0 - 3.6 K/uL   Monocytes Relative 6 %   Monocytes Absolute 0.3 0.2 - 0.9 K/uL   Eosinophils Relative 1 %   Eosinophils Absolute 0.1 0 - 0.7 K/uL   Basophils Relative 1 %   Basophils Absolute 0.0 0 - 0.1 K/uL  Lipase, blood     Status: None   Collection Time: 06/03/15 10:25 AM  Result Value Ref Range   Lipase 21 11 - 51 U/L  hCG, quantitative, pregnancy     Status: Abnormal   Collection Time: 06/03/15 10:25 AM  Result Value Ref Range   hCG, Beta Chain, Quant, S 172790 (H) <5 mIU/mL    Comment:          GEST. AGE      CONC.  (mIU/mL)   <=1 WEEK        5 - 50     2 WEEKS       50 - 500  3 WEEKS       100 - 10,000     4 WEEKS     1,000 - 30,000     5 WEEKS     3,500 - 115,000   6-8 WEEKS     12,000 - 270,000    12 WEEKS     15,000 - 220,000        FEMALE AND NON-PREGNANT FEMALE:     LESS THAN 5 mIU/mL   Urinalysis complete, with microscopic ([facility] only)     Status: Abnormal   Collection Time: 06/03/15 10:27 AM  Result Value Ref Range   Color, Urine YELLOW (A) YELLOW   APPearance CLEAR (A) CLEAR   Glucose, UA NEGATIVE NEGATIVE mg/dL   Bilirubin Urine NEGATIVE NEGATIVE   Ketones, ur NEGATIVE NEGATIVE mg/dL   Specific Gravity, Urine 1.020 1.005 - 1.030   Hgb urine dipstick NEGATIVE NEGATIVE   pH 5.0 5.0 - 8.0   Protein, ur NEGATIVE NEGATIVE mg/dL   Nitrite NEGATIVE NEGATIVE   Leukocytes, UA NEGATIVE NEGATIVE   RBC / HPF NONE SEEN 0 - 5 RBC/hpf   WBC, UA 0-5 0 - 5 WBC/hpf   Bacteria, UA NONE SEEN NONE SEEN   Squamous Epithelial / LPF 0-5 (A) NONE SEEN   Mucous PRESENT   Pregnancy, urine POC     Status: Abnormal   Collection Time: 06/03/15 11:50 AM  Result Value Ref Range   Preg Test, Ur POSITIVE (A) NEGATIVE    Comment:        THE SENSITIVITY OF THIS METHODOLOGY IS >24 mIU/mL   Lipase, blood     Status: None   Collection Time: 08/10/15  5:30 PM  Result Value Ref Range   Lipase 24 11 - 51 U/L  Comprehensive  metabolic panel     Status: Abnormal   Collection Time: 08/10/15  5:30 PM  Result Value Ref Range   Sodium 137 135 - 145 mmol/L   Potassium 3.2 (L) 3.5 - 5.1 mmol/L   Chloride 106 101 - 111 mmol/L   CO2 24 22 - 32 mmol/L   Glucose, Bld 109 (H) 65 - 99 mg/dL   BUN 6 6 - 20 mg/dL   Creatinine, Ser 0.68 0.44 - 1.00 mg/dL   Calcium 9.3 8.9 - 10.3 mg/dL   Total Protein 7.1 6.5 - 8.1 g/dL   Albumin 3.3 (L) 3.5 - 5.0 g/dL   AST 19 15 - 41 U/L   ALT 11 (L) 14 - 54 U/L   Alkaline Phosphatase 65 38 - 126 U/L   Total Bilirubin 0.5 0.3 - 1.2 mg/dL   GFR calc non Af Amer >60 >60 mL/min   GFR calc Af Amer >60 >60 mL/min    Comment: (NOTE) The eGFR has been calculated using the CKD EPI equation. This calculation has not been validated in all clinical situations. eGFR's persistently <60 mL/min signify possible Chronic Kidney Disease.    Anion gap 7 5 - 15  CBC     Status: Abnormal   Collection Time: 08/10/15  5:30 PM  Result Value Ref Range   WBC 5.4 3.6 - 11.0 K/uL   RBC 3.56 (L) 3.80 - 5.20 MIL/uL   Hemoglobin 11.4 (L) 12.0 - 16.0 g/dL   HCT 32.0 (L) 35.0 - 47.0 %   MCV 89.9 80.0 - 100.0 fL   MCH 32.1 26.0 - 34.0 pg   MCHC 35.7 32.0 - 36.0 g/dL   RDW 13.6 11.5 - 14.5 %   Platelets  269 150 - 440 K/uL  Urinalysis complete, with microscopic     Status: Abnormal   Collection Time: 08/10/15  5:30 PM  Result Value Ref Range   Color, Urine AMBER (A) YELLOW   APPearance CLEAR (A) CLEAR   Glucose, UA NEGATIVE NEGATIVE mg/dL   Bilirubin Urine 1+ (A) NEGATIVE   Ketones, ur NEGATIVE NEGATIVE mg/dL   Specific Gravity, Urine 1.023 1.005 - 1.030   Hgb urine dipstick NEGATIVE NEGATIVE   pH 7.0 5.0 - 8.0   Protein, ur 30 (A) NEGATIVE mg/dL   Nitrite NEGATIVE NEGATIVE   Leukocytes, UA NEGATIVE NEGATIVE   RBC / HPF 0-5 0 - 5 RBC/hpf   WBC, UA 0-5 0 - 5 WBC/hpf   Bacteria, UA RARE (A) NONE SEEN   Squamous Epithelial / LPF 0-5 (A) NONE SEEN   Mucous PRESENT   hCG, quantitative, pregnancy      Status: Abnormal   Collection Time: 08/10/15  5:30 PM  Result Value Ref Range   hCG, Beta Chain, Quant, S 23484 (H) <5 mIU/mL    Comment:          GEST. AGE      CONC.  (mIU/mL)   <=1 WEEK        5 - 50     2 WEEKS       50 - 500     3 WEEKS       100 - 10,000     4 WEEKS     1,000 - 30,000     5 WEEKS     3,500 - 115,000   6-8 WEEKS     12,000 - 270,000    12 WEEKS     15,000 - 220,000        FEMALE AND NON-PREGNANT FEMALE:     LESS THAN 5 mIU/mL   Pregnancy, urine POC     Status: Abnormal   Collection Time: 08/10/15  5:43 PM  Result Value Ref Range   Preg Test, Ur POSITIVE (A) NEGATIVE    Comment:        THE SENSITIVITY OF THIS METHODOLOGY IS >24 mIU/mL   Chlamydia/NGC rt PCR ([facility] only)     Status: None   Collection Time: 08/10/15  7:37 PM  Result Value Ref Range   Specimen source GC/Chlam ENDOCERVICAL    Chlamydia Tr NOT DETECTED NOT DETECTED   N gonorrhoeae NOT DETECTED NOT DETECTED    Comment: (NOTE) 100  This methodology has not been evaluated in pregnant women or in 200  patients with a history of hysterectomy. 300 400  This methodology will not be performed on patients less than 87  years of age.   Wet prep, genital     Status: Abnormal   Collection Time: 08/10/15  7:37 PM  Result Value Ref Range   Yeast Wet Prep HPF POC NONE SEEN NONE SEEN   Trich, Wet Prep NONE SEEN NONE SEEN   Clue Cells Wet Prep HPF POC NONE SEEN NONE SEEN   WBC, Wet Prep HPF POC MANY (A) NONE SEEN   Sperm PRESENT   Lipase, blood     Status: None   Collection Time: 08/12/15 11:38 AM  Result Value Ref Range   Lipase 22 11 - 51 U/L  Comprehensive metabolic panel     Status: Abnormal   Collection Time: 08/12/15 11:38 AM  Result Value Ref Range   Sodium 138 135 - 145 mmol/L   Potassium 3.1 (L) 3.5 - 5.1  mmol/L   Chloride 106 101 - 111 mmol/L   CO2 23 22 - 32 mmol/L   Glucose, Bld 91 65 - 99 mg/dL   BUN 6 6 - 20 mg/dL   Creatinine, Ser 0.65 0.44 - 1.00 mg/dL   Calcium 9.0 8.9 - 10.3  mg/dL   Total Protein 7.3 6.5 - 8.1 g/dL   Albumin 3.5 3.5 - 5.0 g/dL   AST 67 (H) 15 - 41 U/L   ALT 50 14 - 54 U/L   Alkaline Phosphatase 99 38 - 126 U/L   Total Bilirubin 2.3 (H) 0.3 - 1.2 mg/dL   GFR calc non Af Amer >60 >60 mL/min   GFR calc Af Amer >60 >60 mL/min    Comment: (NOTE) The eGFR has been calculated using the CKD EPI equation. This calculation has not been validated in all clinical situations. eGFR's persistently <60 mL/min signify possible Chronic Kidney Disease.    Anion gap 9 5 - 15  CBC     Status: Abnormal   Collection Time: 08/12/15 11:38 AM  Result Value Ref Range   WBC 4.7 3.6 - 11.0 K/uL   RBC 3.54 (L) 3.80 - 5.20 MIL/uL   Hemoglobin 11.1 (L) 12.0 - 16.0 g/dL   HCT 32.0 (L) 35.0 - 47.0 %   MCV 90.6 80.0 - 100.0 fL   MCH 31.4 26.0 - 34.0 pg   MCHC 34.7 32.0 - 36.0 g/dL   RDW 13.7 11.5 - 14.5 %   Platelets 273 150 - 440 K/uL  Urinalysis complete, with microscopic     Status: Abnormal   Collection Time: 08/12/15 11:38 AM  Result Value Ref Range   Color, Urine AMBER (A) YELLOW   APPearance CLEAR (A) CLEAR   Glucose, UA NEGATIVE NEGATIVE mg/dL   Bilirubin Urine 2+ (A) NEGATIVE   Ketones, ur TRACE (A) NEGATIVE mg/dL   Specific Gravity, Urine 1.031 (H) 1.005 - 1.030   Hgb urine dipstick NEGATIVE NEGATIVE   pH 5.0 5.0 - 8.0   Protein, ur 30 (A) NEGATIVE mg/dL   Nitrite NEGATIVE NEGATIVE   Leukocytes, UA NEGATIVE NEGATIVE   RBC / HPF NONE SEEN 0 - 5 RBC/hpf   WBC, UA NONE SEEN 0 - 5 WBC/hpf   Bacteria, UA NONE SEEN NONE SEEN   Squamous Epithelial / LPF NONE SEEN NONE SEEN  hCG, quantitative, pregnancy     Status: Abnormal   Collection Time: 08/12/15 11:38 AM  Result Value Ref Range   hCG, Beta Chain, Quant, S 21547 (H) <5 mIU/mL    Comment:          GEST. AGE      CONC.  (mIU/mL)   <=1 WEEK        5 - 50     2 WEEKS       50 - 500     3 WEEKS       100 - 10,000     4 WEEKS     1,000 - 30,000     5 WEEKS     3,500 - 115,000   6-8 WEEKS      12,000 - 270,000    12 WEEKS     15,000 - 220,000        FEMALE AND NON-PREGNANT FEMALE:     LESS THAN 5 mIU/mL   Pregnancy, urine POC     Status: Abnormal   Collection Time: 08/12/15 11:48 AM  Result Value Ref Range   Preg Test, Ur POSITIVE (  A) NEGATIVE    Comment:        THE SENSITIVITY OF THIS METHODOLOGY IS >24 mIU/mL   Glucose, capillary     Status: None   Collection Time: 08/12/15  9:32 PM  Result Value Ref Range   Glucose-Capillary 93 65 - 99 mg/dL  CBC     Status: Abnormal   Collection Time: 08/13/15  4:33 AM  Result Value Ref Range   WBC 4.1 3.6 - 11.0 K/uL   RBC 3.18 (L) 3.80 - 5.20 MIL/uL   Hemoglobin 10.1 (L) 12.0 - 16.0 g/dL   HCT 29.2 (L) 35.0 - 47.0 %   MCV 91.9 80.0 - 100.0 fL   MCH 31.8 26.0 - 34.0 pg   MCHC 34.6 32.0 - 36.0 g/dL   RDW 13.7 11.5 - 14.5 %   Platelets 228 150 - 440 K/uL  Comprehensive metabolic panel     Status: Abnormal   Collection Time: 08/13/15  4:33 AM  Result Value Ref Range   Sodium 137 135 - 145 mmol/L   Potassium 3.5 3.5 - 5.1 mmol/L   Chloride 108 101 - 111 mmol/L   CO2 23 22 - 32 mmol/L   Glucose, Bld 84 65 - 99 mg/dL   BUN <5 (L) 6 - 20 mg/dL   Creatinine, Ser 0.74 0.44 - 1.00 mg/dL   Calcium 8.4 (L) 8.9 - 10.3 mg/dL   Total Protein 6.3 (L) 6.5 - 8.1 g/dL   Albumin 3.0 (L) 3.5 - 5.0 g/dL   AST 69 (H) 15 - 41 U/L   ALT 67 (H) 14 - 54 U/L   Alkaline Phosphatase 87 38 - 126 U/L   Total Bilirubin 2.1 (H) 0.3 - 1.2 mg/dL   GFR calc non Af Amer >60 >60 mL/min   GFR calc Af Amer >60 >60 mL/min    Comment: (NOTE) The eGFR has been calculated using the CKD EPI equation. This calculation has not been validated in all clinical situations. eGFR's persistently <60 mL/min signify possible Chronic Kidney Disease.    Anion gap 6 5 - 15  Glucose, capillary     Status: None   Collection Time: 08/13/15  5:39 AM  Result Value Ref Range   Glucose-Capillary 72 65 - 99 mg/dL  Glucose, capillary     Status: None   Collection Time:  08/13/15  3:17 PM  Result Value Ref Range   Glucose-Capillary 70 65 - 99 mg/dL  Hepatic function panel     Status: Abnormal   Collection Time: 08/14/15  7:12 AM  Result Value Ref Range   Total Protein 6.1 (L) 6.5 - 8.1 g/dL   Albumin 2.9 (L) 3.5 - 5.0 g/dL   AST 52 (H) 15 - 41 U/L   ALT 74 (H) 14 - 54 U/L   Alkaline Phosphatase 82 38 - 126 U/L   Total Bilirubin 0.9 0.3 - 1.2 mg/dL   Bilirubin, Direct 0.4 0.1 - 0.5 mg/dL   Indirect Bilirubin 0.5 0.3 - 0.9 mg/dL  Lipase, blood     Status: None   Collection Time: 08/14/15  7:12 AM  Result Value Ref Range   Lipase 20 11 - 51 U/L    Recent Labs  08/12/15 1138 08/13/15 0433  HGB 11.1* 10.1*  HCT 32.0* 29.2*        Physical Exam:  General: alert and cooperative DVT Evaluation: No evidence of DVT seen on physical exam. Abdomen: abdomen is soft without significant tenderness, masses, organomegaly or guarding Lungs: no increased  work of breathing  Prenatal Labs: pending  Discharge Diagnoses: choledocholithiasis, no evidence of cholecystitis  Discharge Information: Date: 08/15/2015 Activity: unrestricted Diet: routine and avoid fatty/spicy foods Medications: PNV, Percocet and phenergan Condition: stable Instructions: per AVS Discharge to: home Follow-up Information    Follow up with [facility] In 2 weeks.   Specialty:  General Surgery   Why:  to discuss elective cholecystectomy after birth of child   Contact information:   [place] [phone]      Follow-up with Daneil Dan on 08/23/15 at 3 pm for prenatal visit  Burlene Arnt, [place] 08/15/2015, 9:53 AM

## 2015-08-19 LAB — GLUCOSE, CAPILLARY: Glucose-Capillary: 119 mg/dL — ABNORMAL HIGH (ref 65–99)

## 2015-08-23 ENCOUNTER — Ambulatory Visit: Payer: Medicaid Other | Admitting: General Surgery

## 2015-08-27 ENCOUNTER — Encounter: Payer: Self-pay | Admitting: Surgery

## 2015-08-27 ENCOUNTER — Ambulatory Visit (INDEPENDENT_AMBULATORY_CARE_PROVIDER_SITE_OTHER): Payer: Medicaid Other | Admitting: Surgery

## 2015-08-27 VITALS — BP 126/65 | HR 86 | Temp 98.6°F | Ht 64.0 in | Wt 233.0 lb

## 2015-08-27 DIAGNOSIS — Z3A21 21 weeks gestation of pregnancy: Secondary | ICD-10-CM

## 2015-08-27 DIAGNOSIS — K8050 Calculus of bile duct without cholangitis or cholecystitis without obstruction: Secondary | ICD-10-CM | POA: Diagnosis not present

## 2015-08-27 NOTE — Patient Instructions (Signed)
Please call our office if you have any questions or concerns.  

## 2015-08-27 NOTE — Progress Notes (Signed)
Subjective:     Patient ID: Robin Davenport, female   DOB: [birth year], 30 y.o.   MRN: [id number]  HPI  30 yr old female Who is [redacted] weeks pregnant comes in today after having choledocholithiasis and ERCP by Dr. Servando SnareWohl on June 27. Patient states that since she had the procedure that her belly pain has much improved that she hasn't had the same twinges of pain. Patient states that she has had a decreased appetite but it seems to be slowly improving. Patient states that prior to the ERCP she had severe pain and nausea but this is all gone away. She denies any fever chills or having any jaundice.  Past Medical History  Diagnosis Date  . Anemia   . Diabetes mellitus without complication [facility](HCC)     patient states was in pregnancy only   Past Surgical History  Procedure Laterality Date  . Cesarean section  06/26/2014    Procedure: CESAREAN SECTION;  Surgeon: Ardsley Bingharlie Pickens, MD;  Location: [facility] ORS;  Service: Obstetrics;;  . Ercp N/A 08/13/2015    Procedure: ENDOSCOPIC RETROGRADE CHOLANGIOPANCREATOGRAPHY (ERCP);  Surgeon: [name] Wohl, MD;  [facility] ENDOSCOPY;  Service: Endoscopy;  Laterality: N/A;  . Ercp  08/13/2015   History reviewed. No pertinent family history. Social History   Social History  . Marital Status: Single    Spouse Name: N/A  . Number of Children: N/A  . Years of Education: N/A   Social History Main Topics  . Smoking status: Never Smoker   . Smokeless tobacco: Never Used  . Alcohol Use: No  . Drug Use: None  . Sexual Activity: Not Asked   Other Topics Concern  . None   Social History Narrative    Current outpatient prescriptions:  .  acetaminophen (TYLENOL) 325 MG tablet, Take 650 mg by mouth every 6 (six) hours as needed for mild pain or headache., Disp: , Rfl:  .  Prenatal Multivit-Min-Fe-FA (PRENATAL VITAMINS) 0.8 MG tablet, Take 1 tablet by mouth daily., Disp: 30 tablet, Rfl: 0 .  promethazine (PHENERGAN) 12.5 MG tablet, Take 1 tablet (12.5 mg total) by mouth  every 6 (six) hours as needed for nausea or vomiting., Disp: 30 tablet, Rfl: 0 No Known Allergies   Review of Systems  Constitutional: Positive for appetite change. Negative for fever, chills, activity change and unexpected weight change.  HENT: Negative for congestion and sore throat.   Respiratory: Negative for cough, choking, shortness of breath and wheezing.   Cardiovascular: Negative for chest pain, palpitations and leg swelling.  Gastrointestinal: Negative for nausea, vomiting, abdominal pain, diarrhea, constipation and abdominal distention.  Genitourinary: Negative for dysuria and hematuria.  Musculoskeletal: Negative for back pain and neck pain.  Skin: Negative for color change, pallor, rash and wound.  Neurological: Negative for dizziness and weakness.  Hematological: Negative for adenopathy. Does not bruise/bleed easily.  Psychiatric/Behavioral: Negative for agitation. The patient is not nervous/anxious.   All other systems reviewed and are negative.      Objective:   Physical Exam  Constitutional: She is oriented to person, place, and time. She appears well-developed and well-nourished. No distress.  HENT:  Head: Normocephalic and atraumatic.  Right Ear: External ear normal.  Left Ear: External ear normal.  Nose: Nose normal.  Mouth/Throat: Oropharynx is clear and moist. No oropharyngeal exudate.  Eyes: Conjunctivae and EOM are normal. Pupils are equal, round, and reactive to light. No scleral icterus.  Neck: Normal range of motion. Neck supple. No tracheal deviation present.  Cardiovascular: Normal  rate, regular rhythm, normal heart sounds and intact distal pulses.  Exam reveals no gallop and no friction rub.   No murmur heard. Pulmonary/Chest: Effort normal and breath sounds normal. No respiratory distress. She has no wheezes. She has no rales.  Abdominal: Soft. Bowel sounds are normal. She exhibits no distension. There is no tenderness. There is no rebound.   Musculoskeletal: Normal range of motion. She exhibits no edema or tenderness.  Neurological: She is alert and oriented to person, place, and time. No cranial nerve deficit.  Skin: Skin is warm and dry. No rash noted. No erythema. No pallor.  Psychiatric: She has a normal mood and affect. Her behavior is normal. Judgment and thought content normal.  Vitals reviewed.      Filed Vitals:   08/27/15 1051  BP: 126/65  Pulse: 86  Temp: 98.6 F (37 C)    Assessment:     30 yr old female with choledocholithiasis and [redacted] weeks pregnant    Plan:     I personally reviewed the patient's past medical history including her recent emergency room visit and admission for choledocholithiasis. Patient did undergo ERCP which I have reviewed that as well. I have also reviewed her ultrasound images showing dilated common bile duct and stones in the gallbladder. Also reviewed the radiology reads. I did discuss with the patient that given she is [redacted] weeks pregnant that she is at the ideal time in the second trimester to have an operation performed. I did discuss with her that we could do this in the next few weeks and a laparoscopic procedure and remove her gallbladder. I did discuss with her that this would run a risk of premature delivery. She also understands that she could get choledocholithiasis or even acute cholecystitis again which will also put her risk for premature delivery and it rose to the infant. The patient would like to hold off until after she delivers to have this operation. I did discuss with the patient that she begins to have abdominal pain like she had before nausea and vomiting that she should give [place] a call or she has any signs of jaundice. Otherwise she is to call after she completes her pregnancy and when she is ready to have her gallbladder removed.

## 2016-01-15 IMAGING — US US EXTREM  UP VENOUS*R*
1 series · 14 of 24 positions shown · non-contrast
Comparison: None.

CLINICAL DATA: Pain, swelling, redness post IV removal. TWO DAYS
POST C-SECTION.

EXAM:
RIGHT UPPER EXTREMITY VENOUS DOPPLER ULTRASOUND
TECHNIQUE: Gray-scale sonography with graded compression, as well as color
Doppler and duplex ultrasound were performed to evaluate the upper
extremity deep venous system from the level of the subclavian vein
and including the jugular, axillary, basilic and upper cephalic
vein. Spectral Doppler was utilized to evaluate flow at rest and
with distal augmentation maneuvers.

[Series 1: us extrem up venous*right* · 0.08mm/px · 14 of 63 slices shown]
[im 1/63]
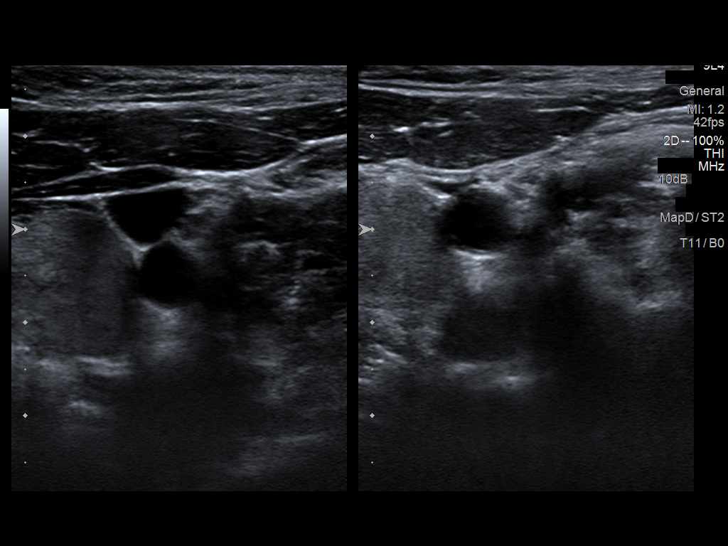
[im 6/63]
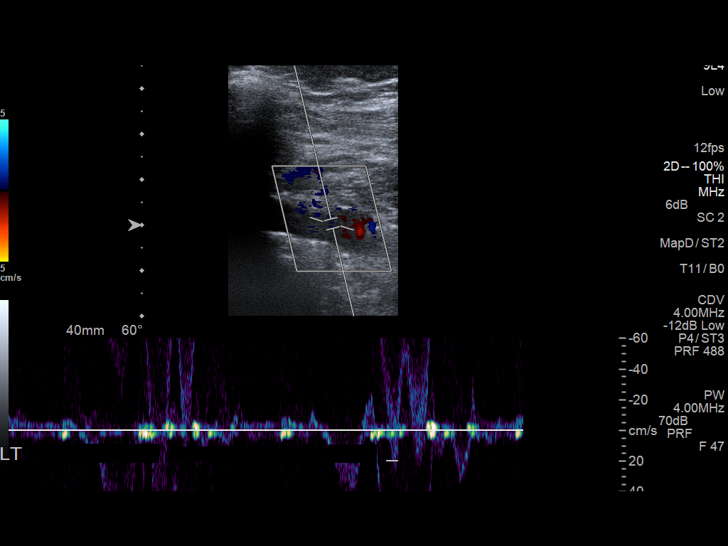
[im 11/63]
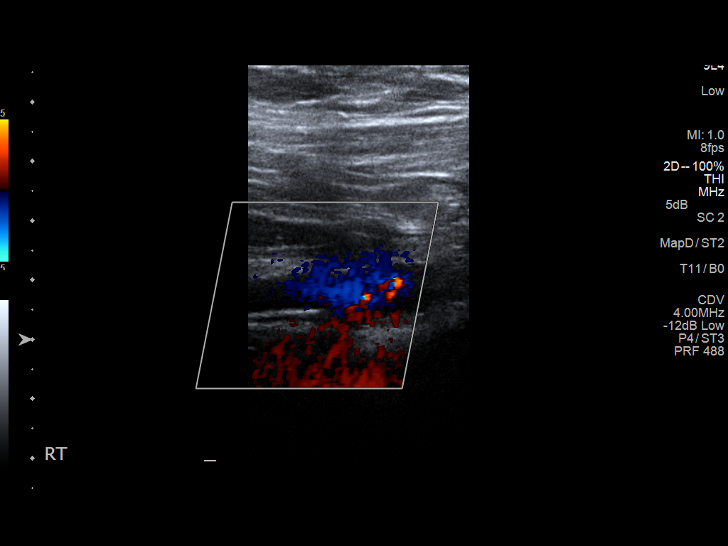
[im 17/63]
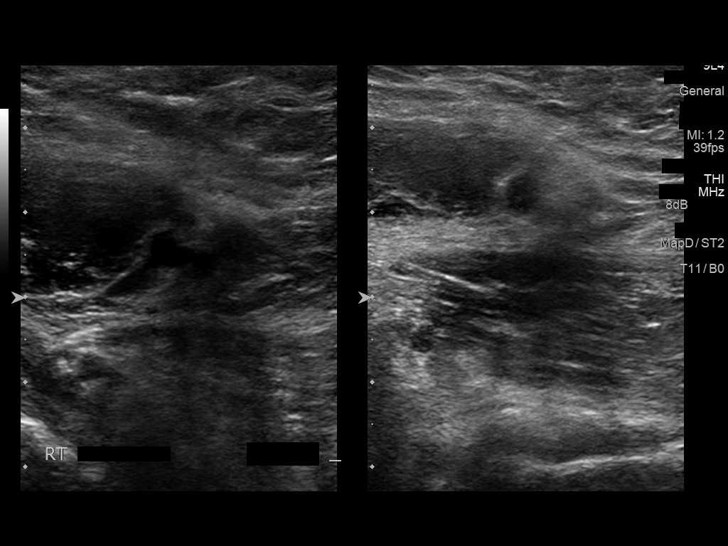
[im 19/63]
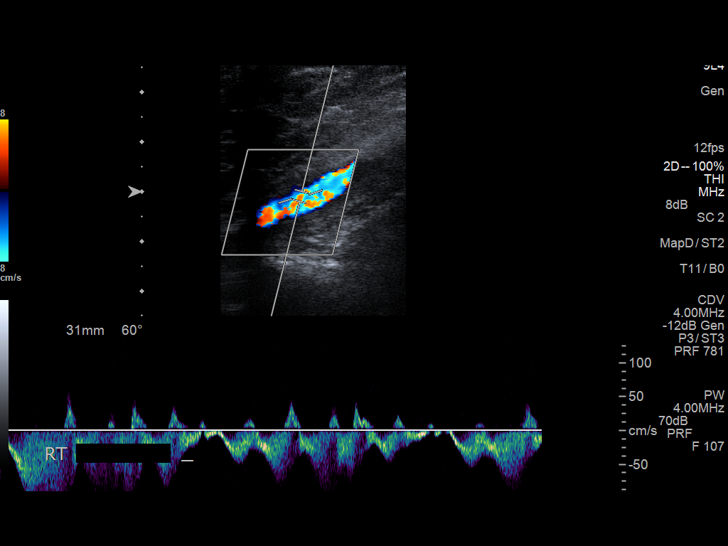
[im 25/63]
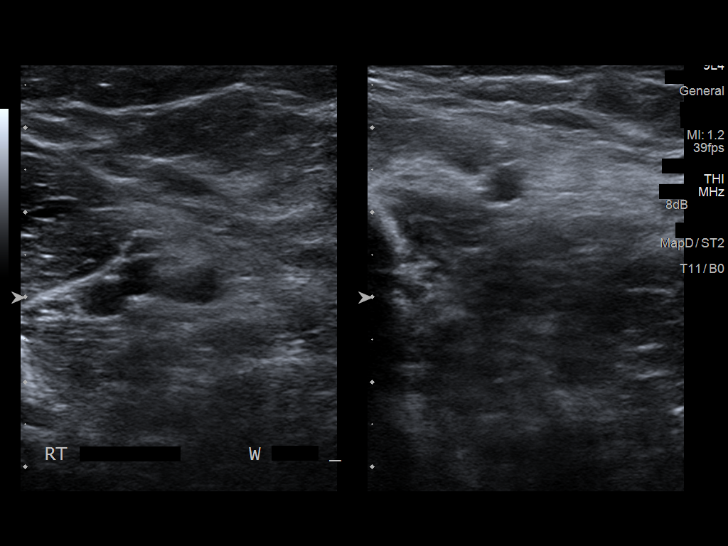
[im 30/63]
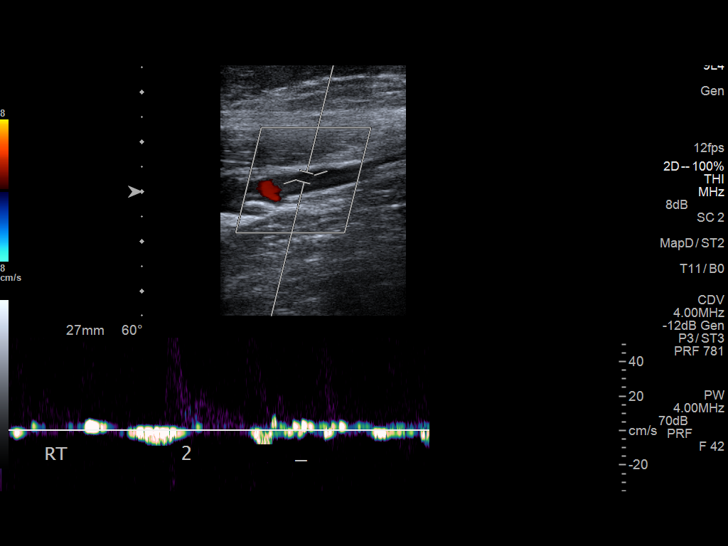
[im 33/63]
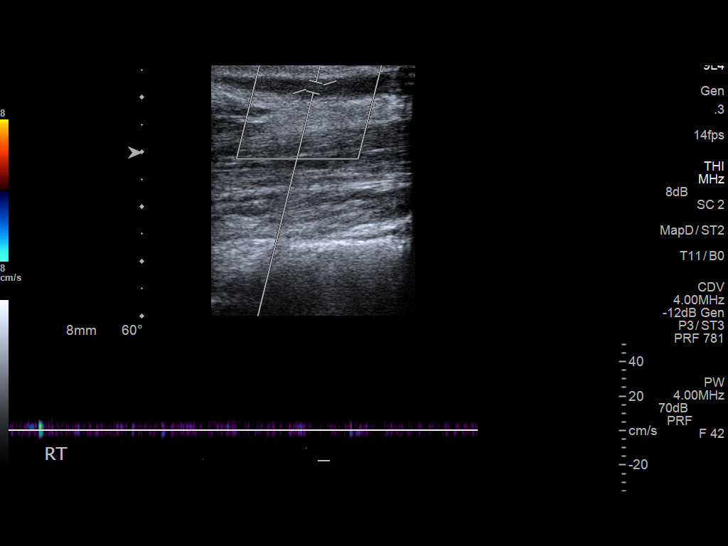
[im 38/63]
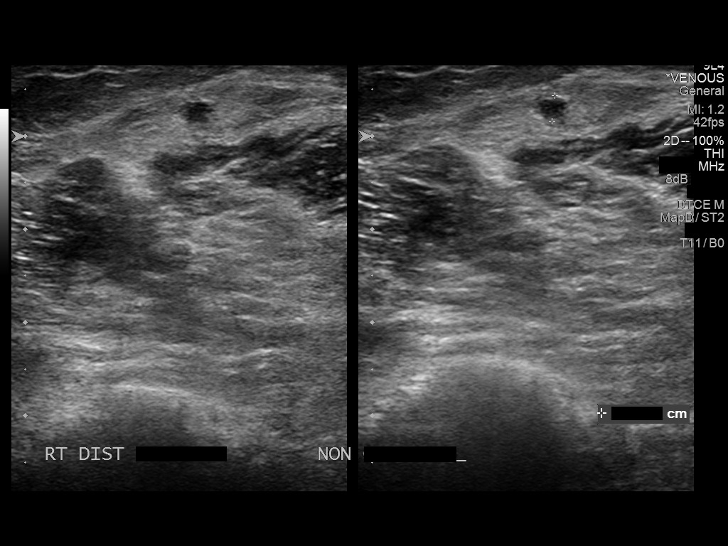
[im 44/63]
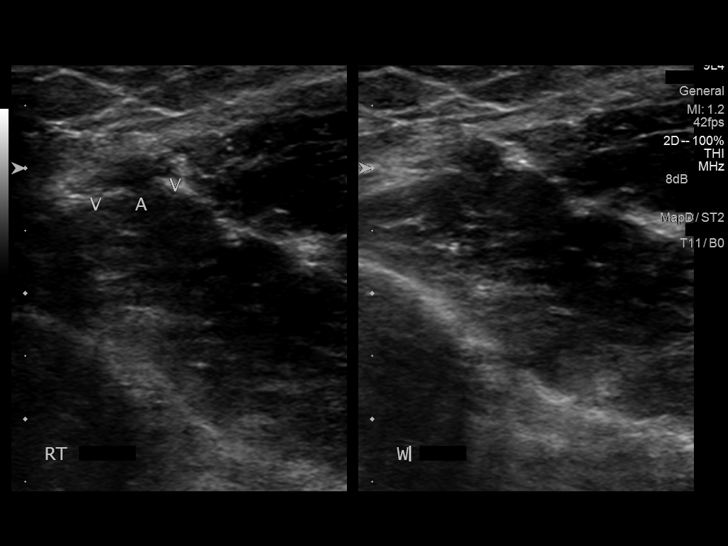
[im 49/63]
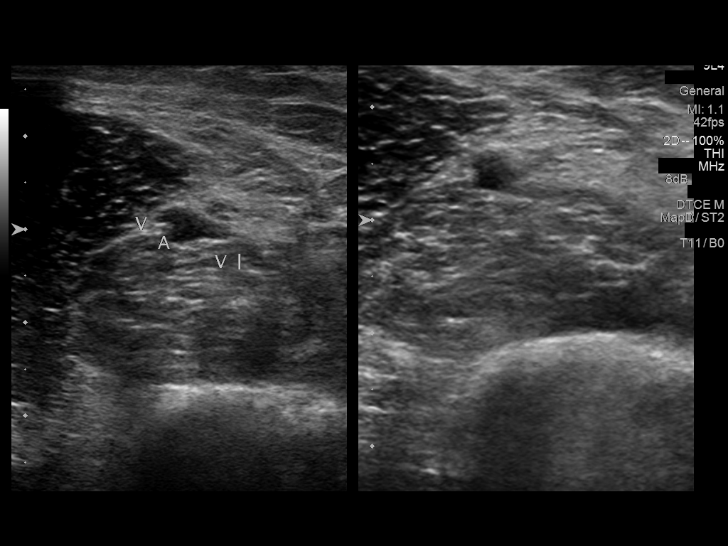
[im 52/63]
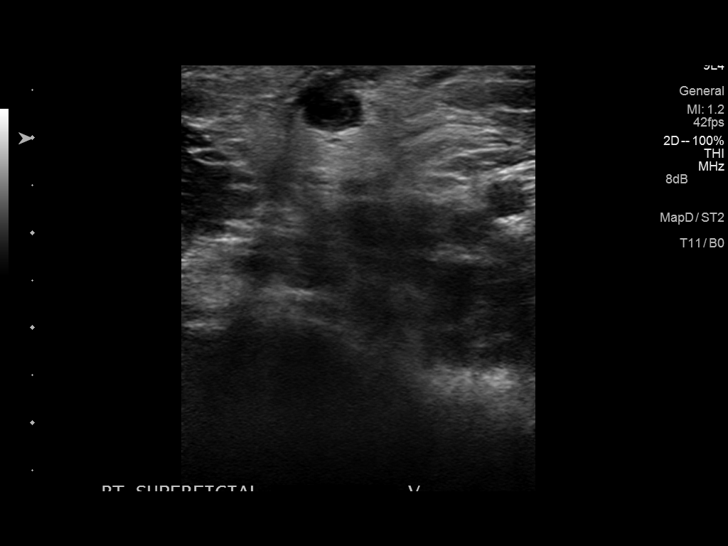
[im 57/63]
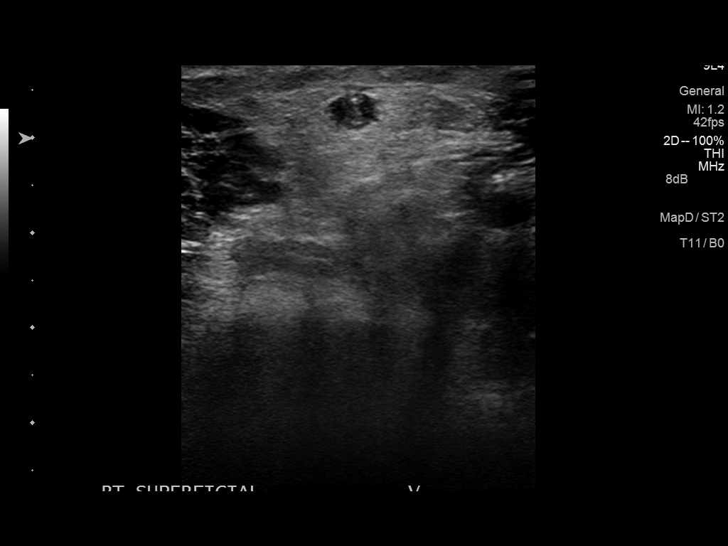
[im 63/63]
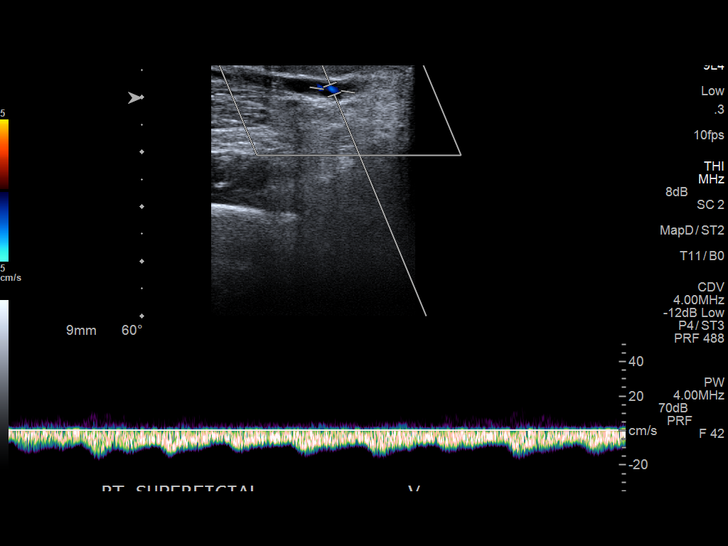

[14 of 24 positions shown; findings below may reference images not displayed]

FINDINGS: Thrombus within deep veins:  None visualized.

Compressibility of deep veins:  Normal.

Duplex waveform respiratory phasicity:  Normal.

Duplex waveform response to augmentation:  Normal.

Venous reflux:  None visualized.

Other findings:  Contralateral IJ and subclavian veins unremarkable.

There is segmental thrombosis of the cephalic vein near the
antecubital fossa, patent centrally.
IMPRESSION: 1. Negative for right lower extremity DVT.
2. Superficial thrombophlebitis of a segment of the right cephalic
vein as above.

## 2017-02-26 IMAGING — US US OB LIMITED
1 series · 14 of 27 positions shown · non-contrast
Comparison: none

CLINICAL DATA: 29-year-old pregnant female with right upper
quadrant pelvic.

EXAM:
LIMITED OBSTETRIC ULTRASOUND

[Series 1: us ob limited · 0.25mm/px · 14 of 27 slices shown]
[im 1/27]
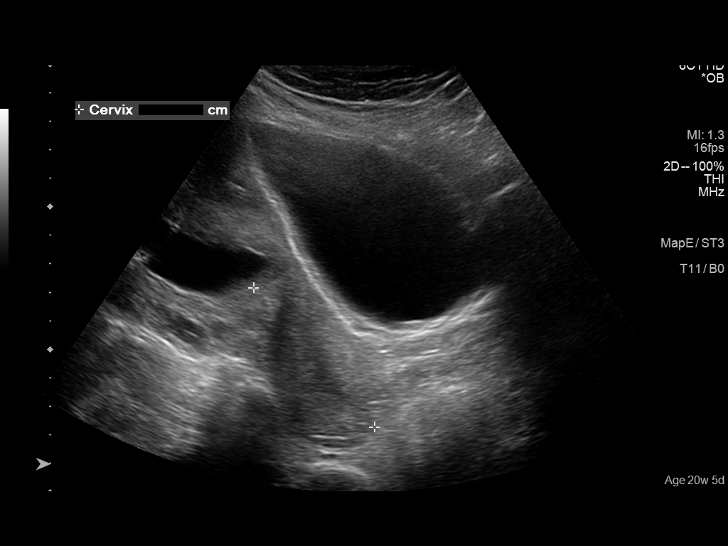
[im 3/27]
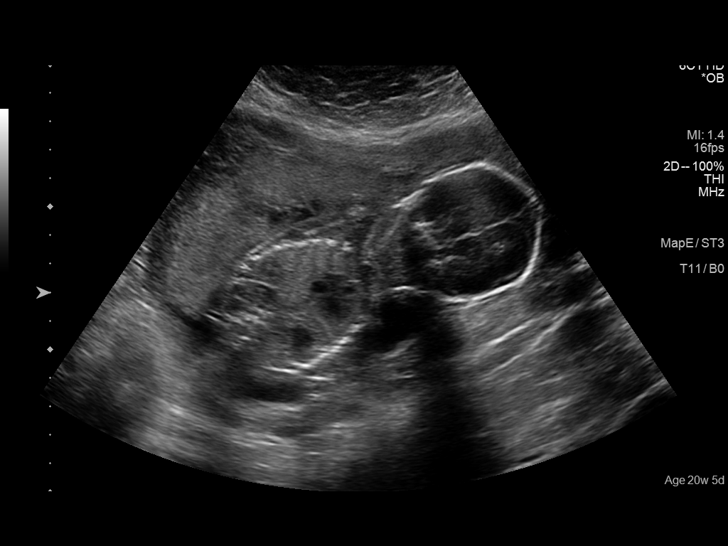
[im 5/27]
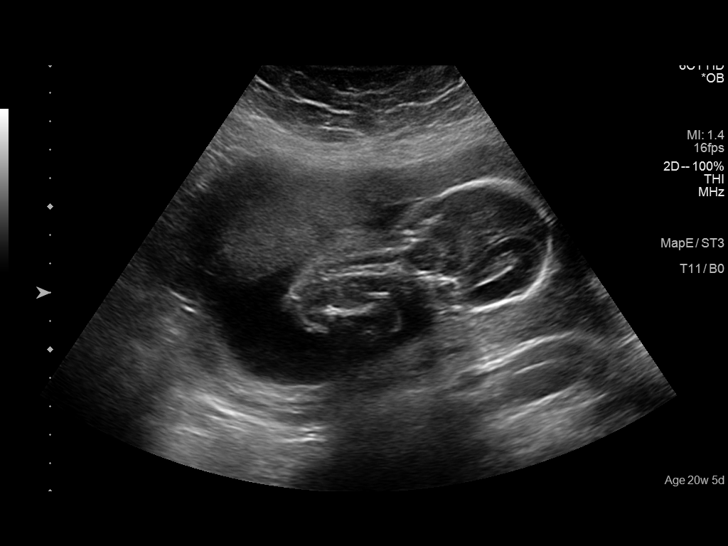
[im 7/27]
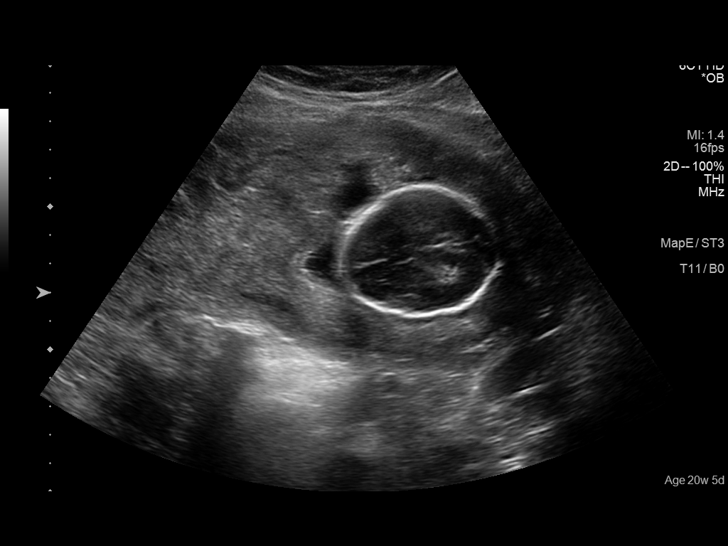
[im 9/27]
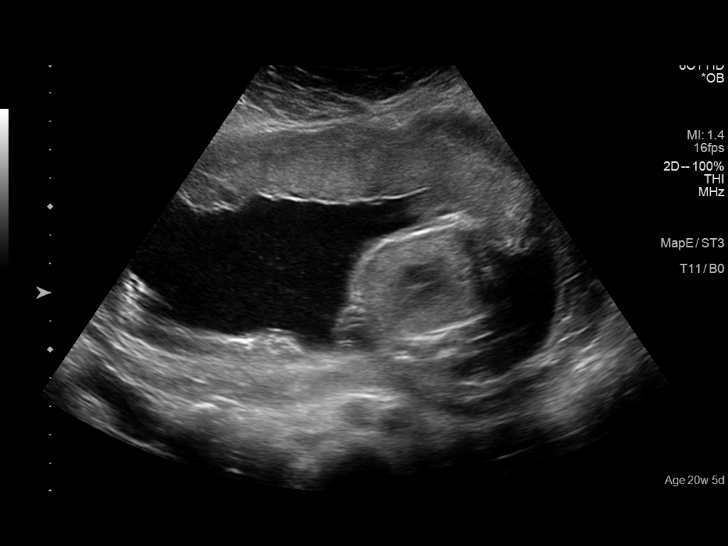
[im 11/27]
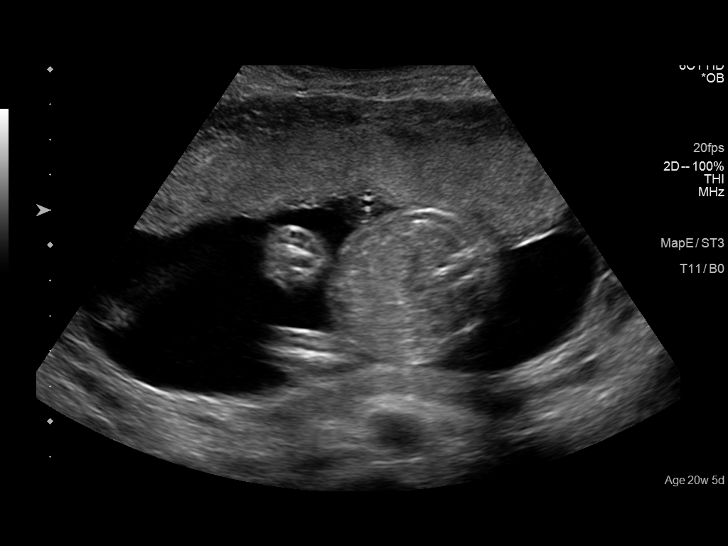
[im 13/27]
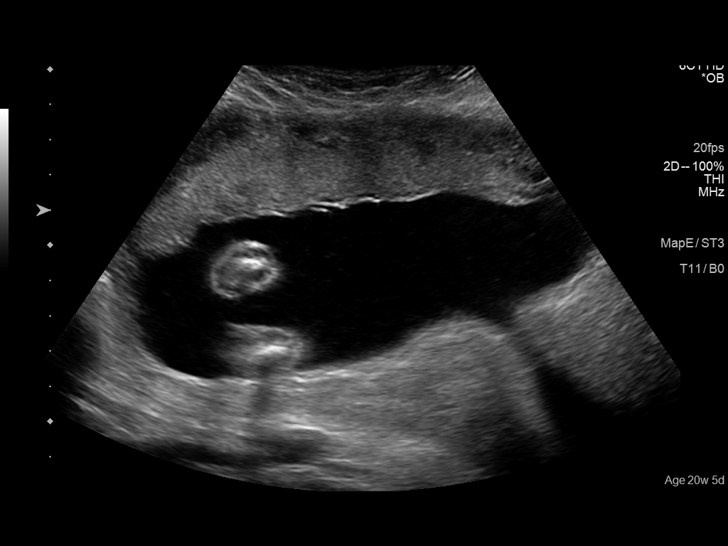
[im 15/27]
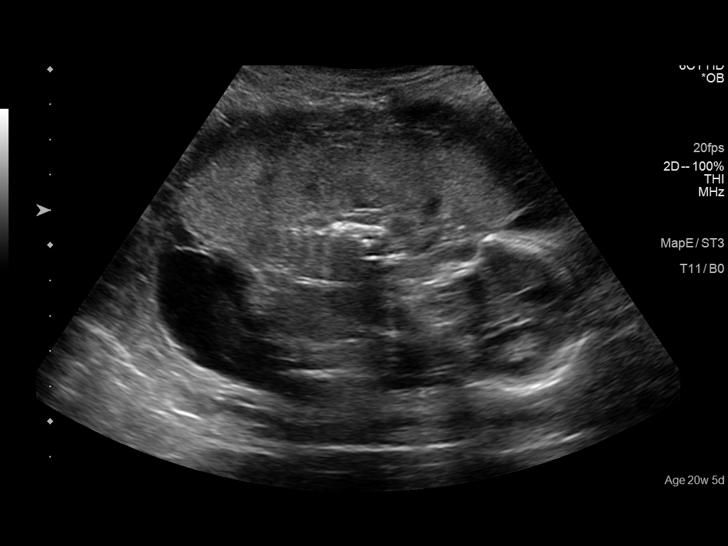
[im 17/27]
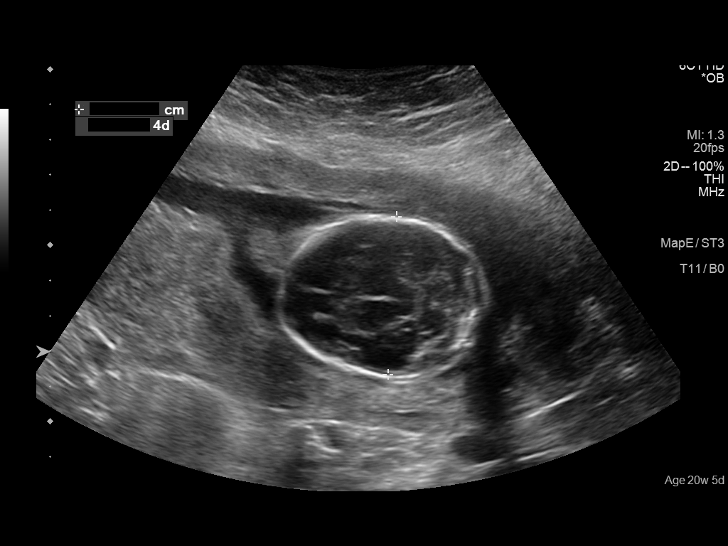
[im 19/27]
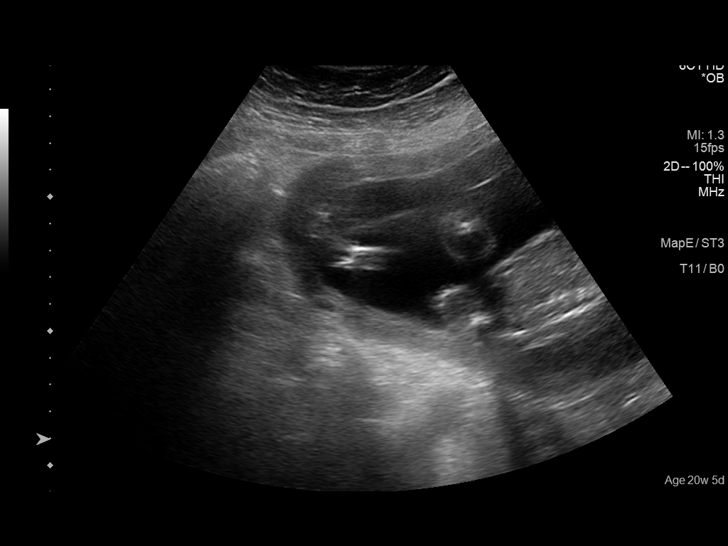
[im 21/27]
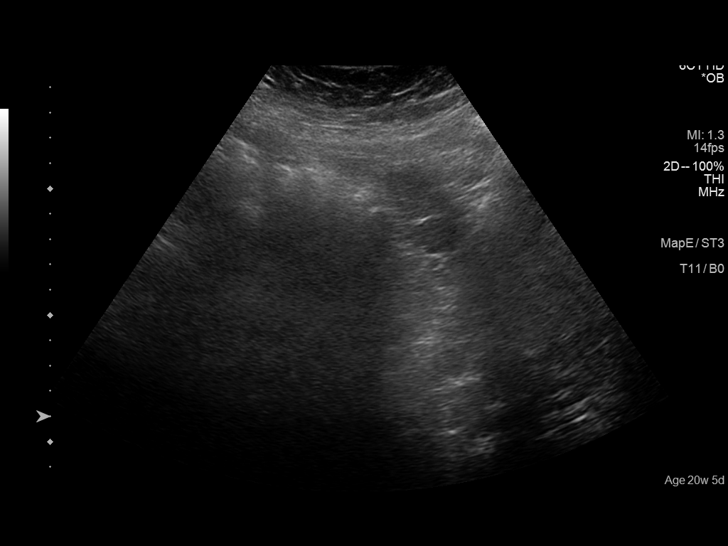
[im 23/27]
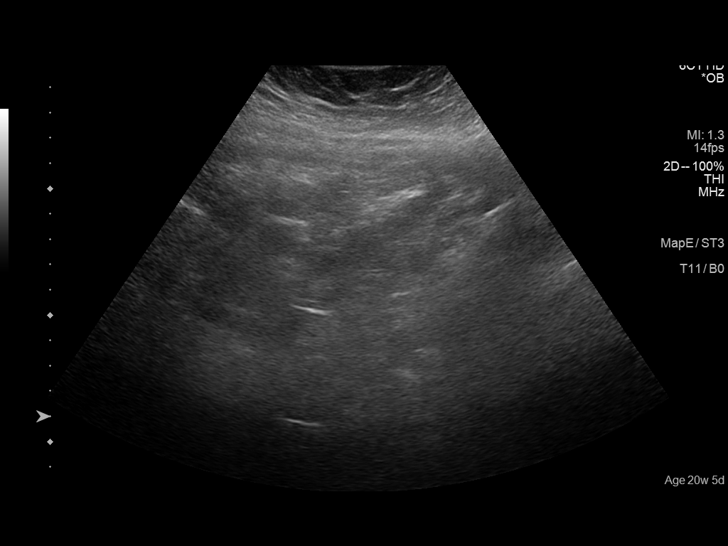
[im 25/27]
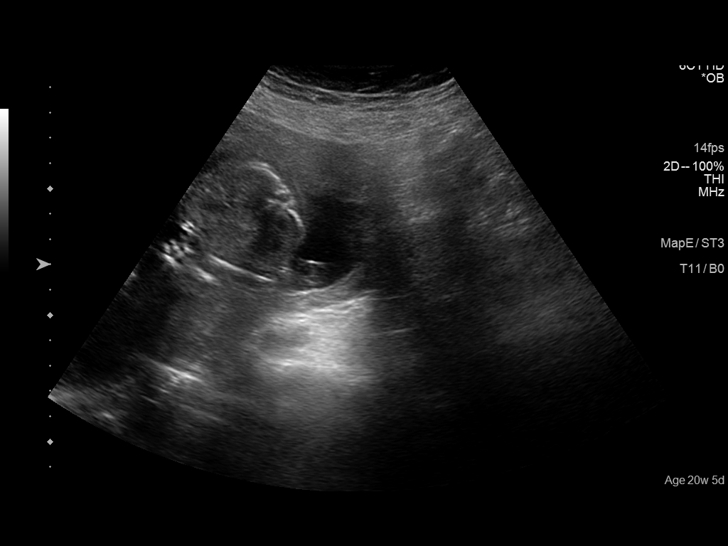
[im 27/27]
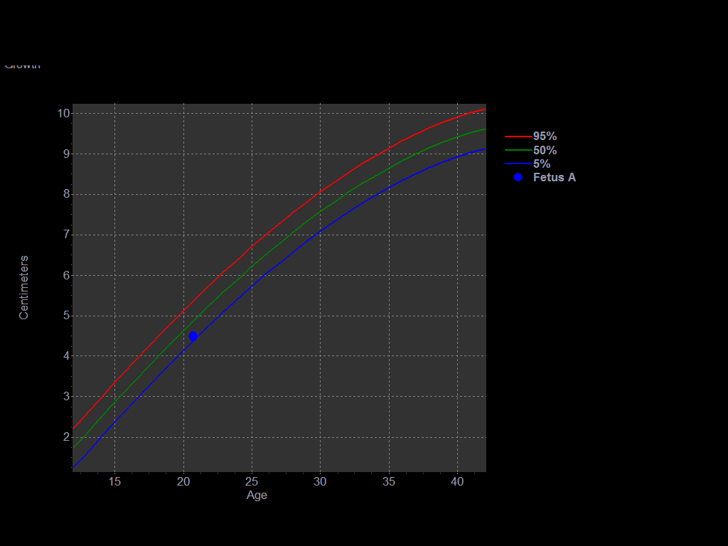

[14 of 27 positions shown; findings below may reference images not displayed]

FINDINGS: Number of Fetuses:  Single

Heart Rate:  145 bpm

Movement:  Seen

Presentation: Cephalic

Placental Location: Anterior.

Previa: None

Amniotic Fluid (Subjective): Within normal limits. Multiple venous
lakes noted in the placenta.

BPD:  4.5cm 19w  foldd

MATERNAL FINDINGS:

Cervix:  Appears closed.

Uterus/Adnexae:  The ovaries are not visualized.
IMPRESSION: Single live intrauterine pregnancy. No acute abnormality identified.

This exam is performed on an emergent basis and does not
comprehensively evaluate fetal size, dating, or anatomy; follow-up
complete OB US should be considered if further fetal assessment is
warranted.

## 2017-09-05 IMAGING — US US OB COMP LESS 14 WK
1 series · 14 of 28 positions shown · non-contrast
Comparison: None.

CLINICAL DATA: Left pelvic pain, nausea and vomiting for 2 days.
Quantitative HCG [id number].

EXAM:
OBSTETRIC <14 WK US AND TRANSVAGINAL OB US
TECHNIQUE: Both transabdominal and transvaginal ultrasound examinations were
performed for complete evaluation of the gestation as well as the
maternal uterus, adnexal regions, and pelvic cul-de-sac.
Transvaginal technique was performed to assess early pregnancy.

[Series 1: us ob comp less 14 wk · 0.22mm/px · 14 of 50 slices shown]
[im 2/50]
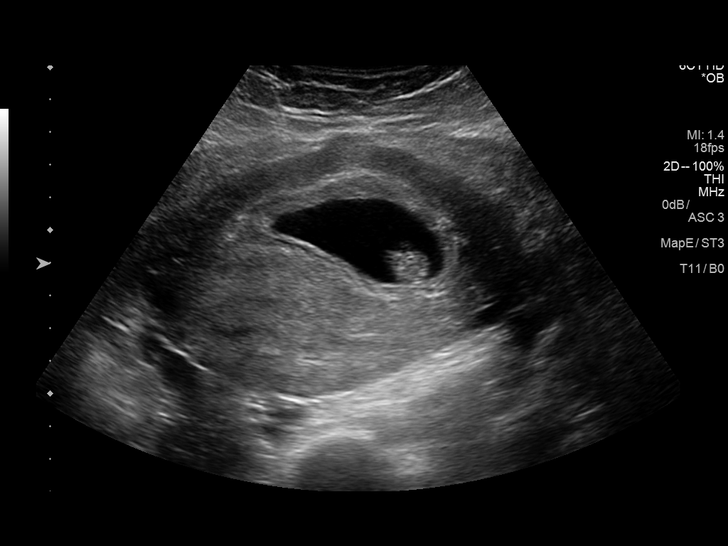
[im 6/50]
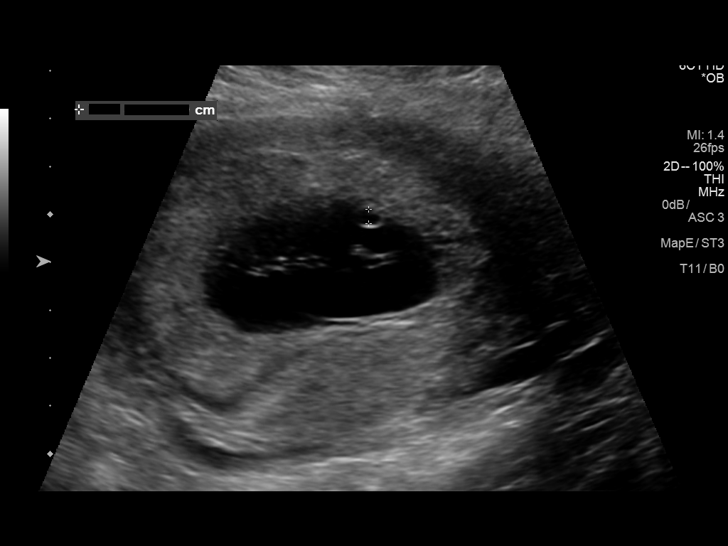
[im 10/50]
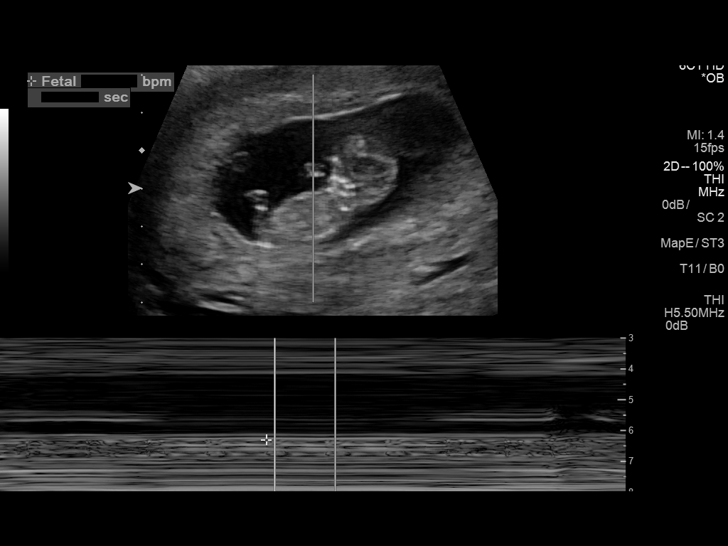
[im 13/50]
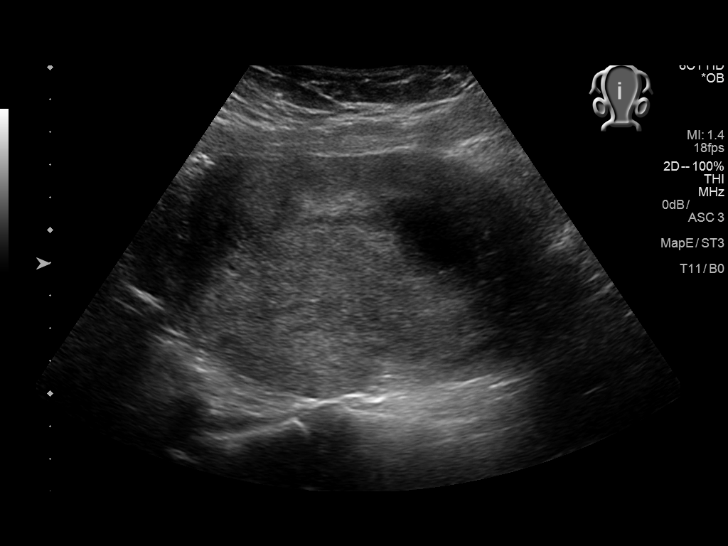
[im 17/50]
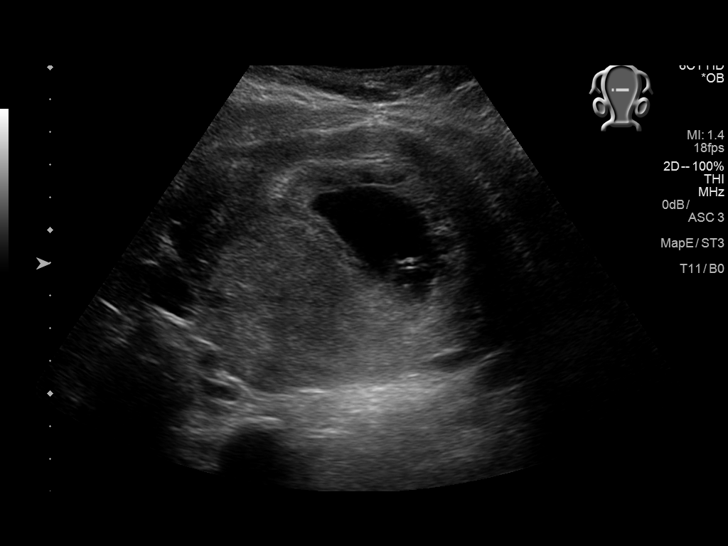
[im 20/50]
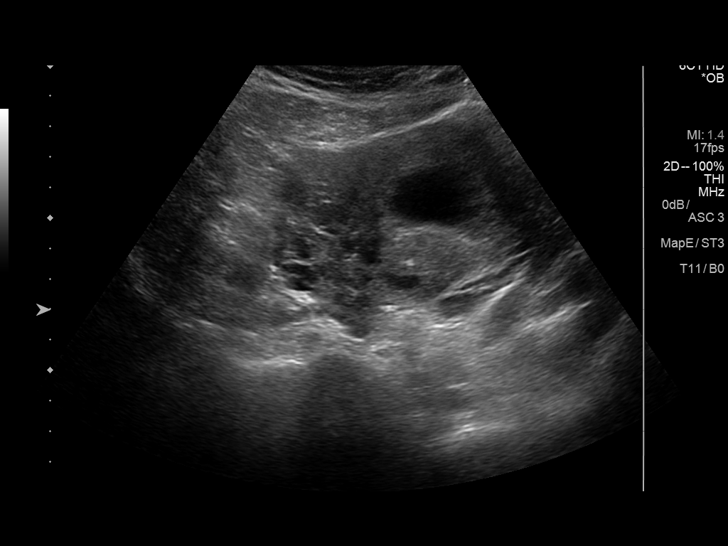
[im 24/50]
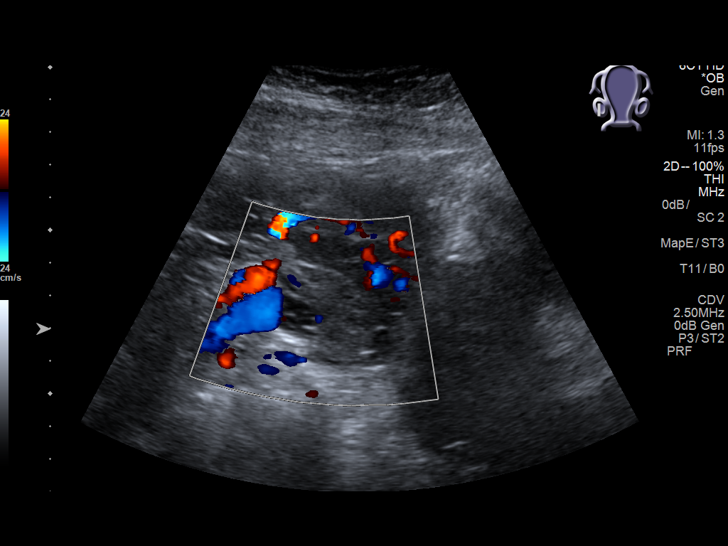
[im 28/50]
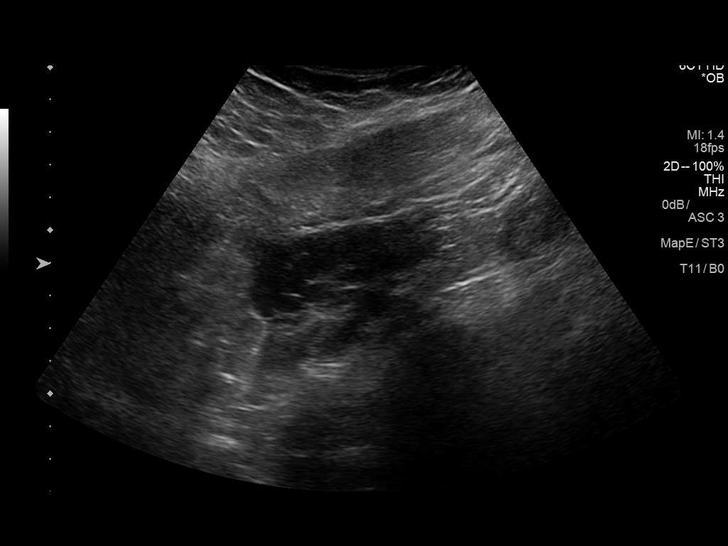
[im 31/50]
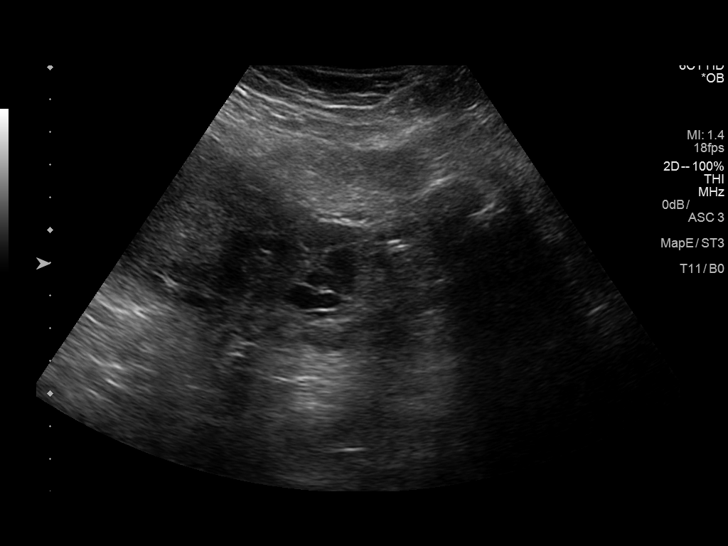
[im 35/50]
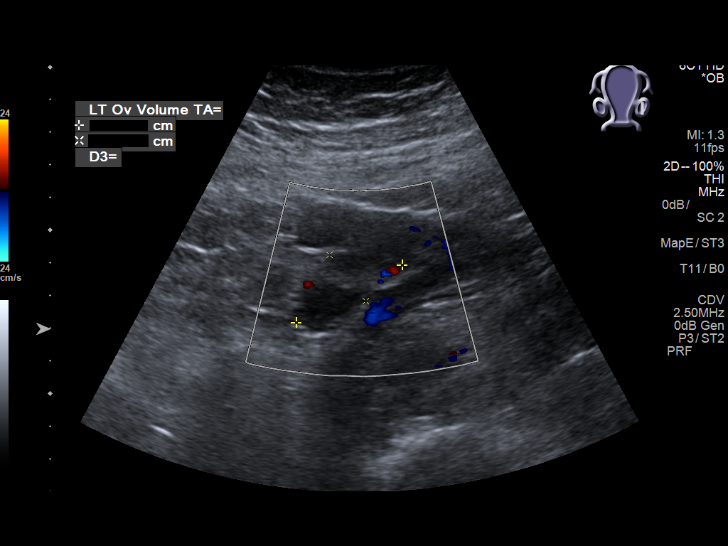
[im 39/50]
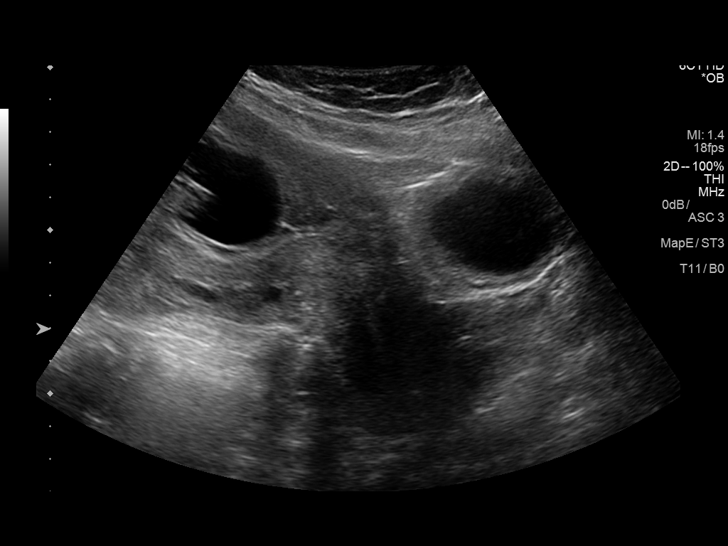
[im 42/50]
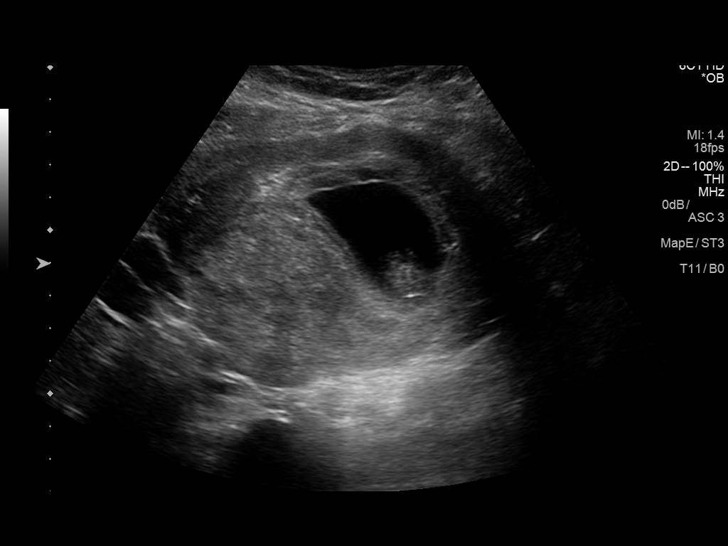
[im 46/50]
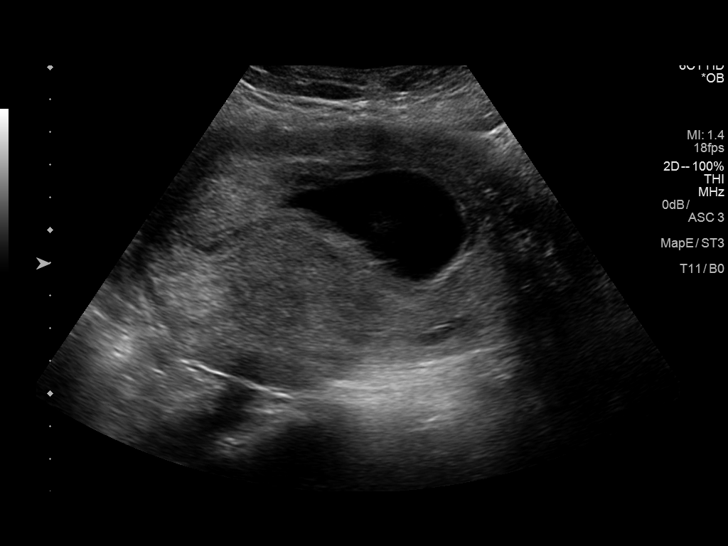
[im 50/50]
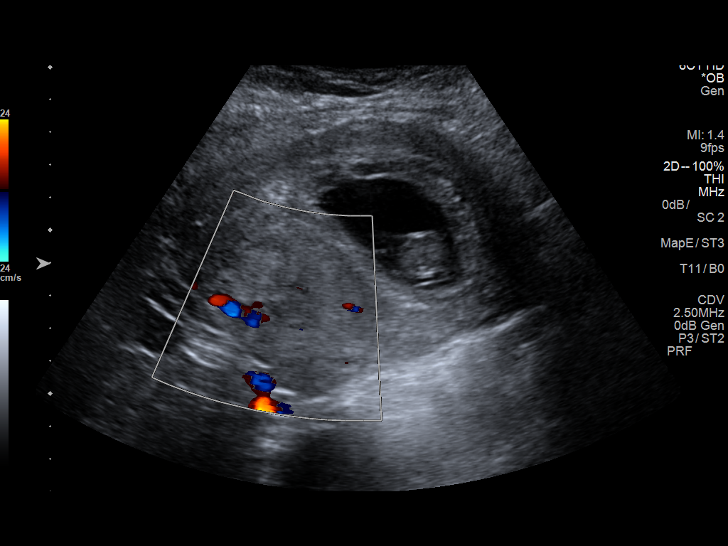

[14 of 28 positions shown; findings below may reference images not displayed]

FINDINGS: Intrauterine gestational sac: Visualized.

Yolk sac:  Visualized.

Embryo:  Visualized.

Cardiac Activity: Detected.

Heart Rate: 169  bpm

CRL:  41.9  mm   11 w   0 d                  US EDC: 12/23/2015.

Subchorionic hemorrhage:  None visualized.

Maternal uterus/adnexae: At 9 Sir unremarkable. 6.7 cm posterior
uterine fibroid in the fundus is noted.
IMPRESSION: Single living anterior pregnancy.  No acute abnormality.

## 2017-11-14 IMAGING — US US ABDOMEN LIMITED
1 series · 14 of 25 positions shown · non-contrast
Comparison: 08/10/2015

CLINICAL DATA: Right upper quadrant abdominal pain. Symptoms for 1
week

EXAM:
US ABDOMEN LIMITED - RIGHT UPPER QUADRANT

[Series 1: us abdomen limited · 0.25mm/px · 14 of 60 slices shown]
[im 1/60]
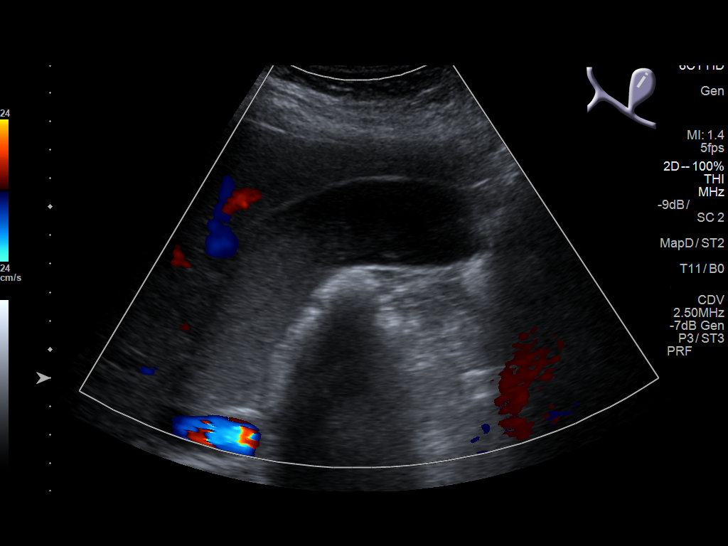
[im 5/60]
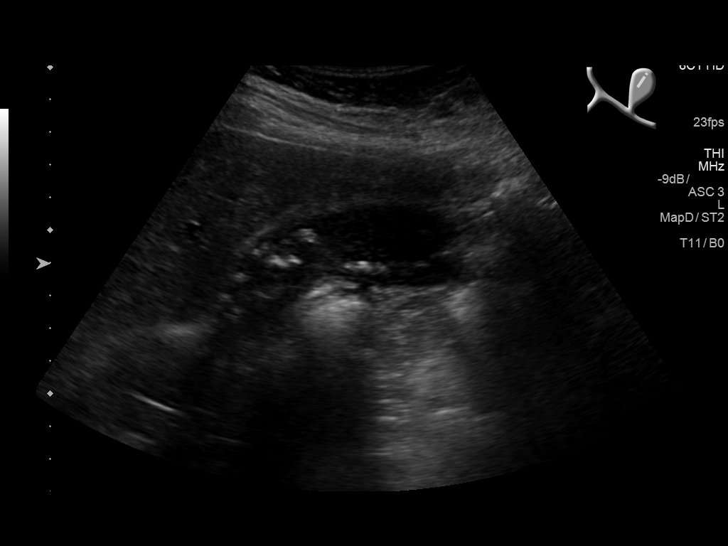
[im 10/60]
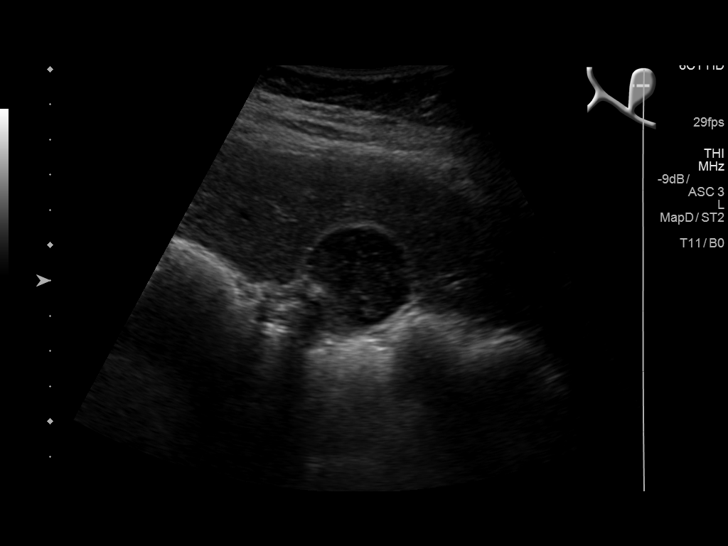
[im 15/60]
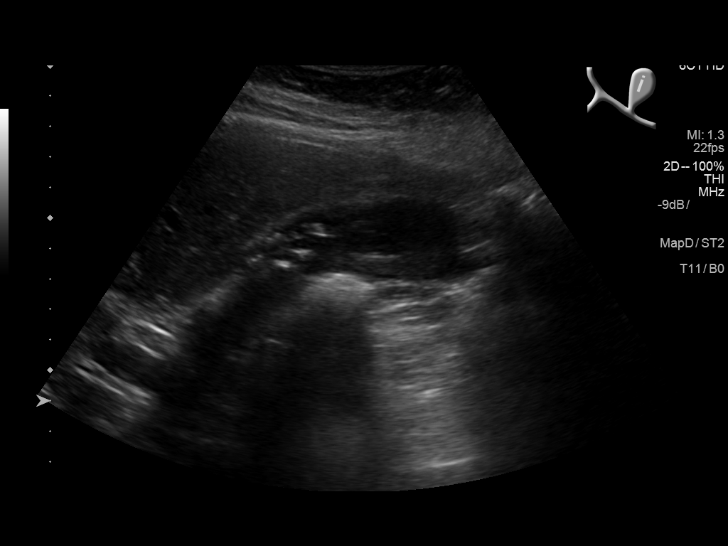
[im 20/60]
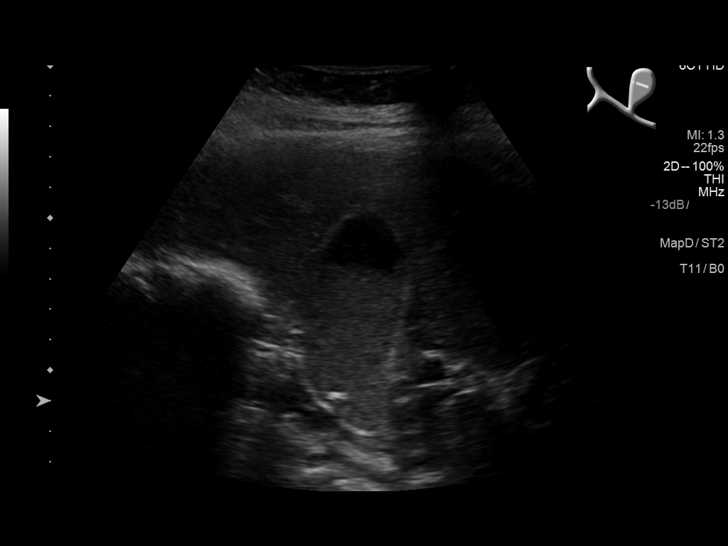
[im 23/60]
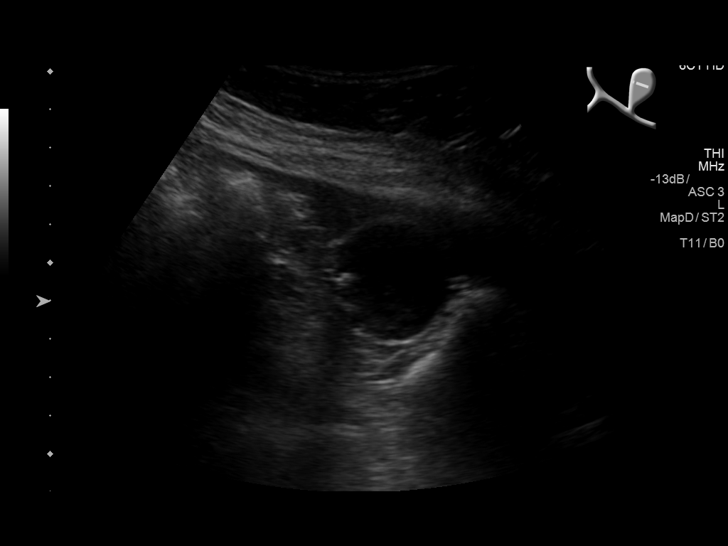
[im 28/60]
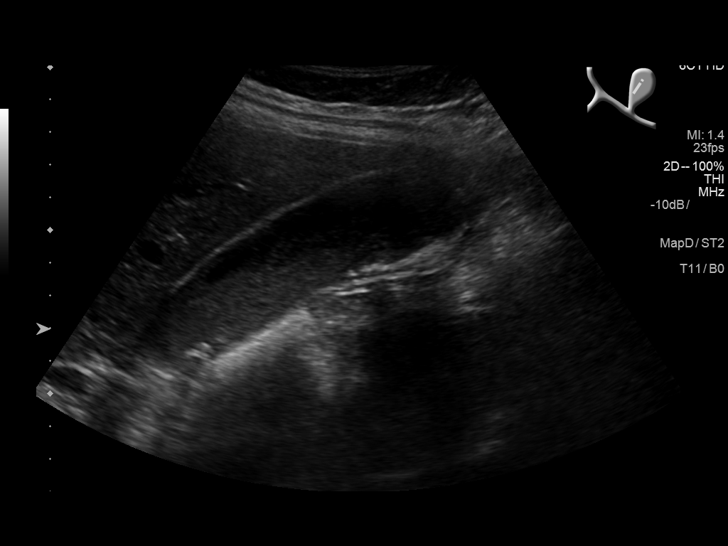
[im 32/60]
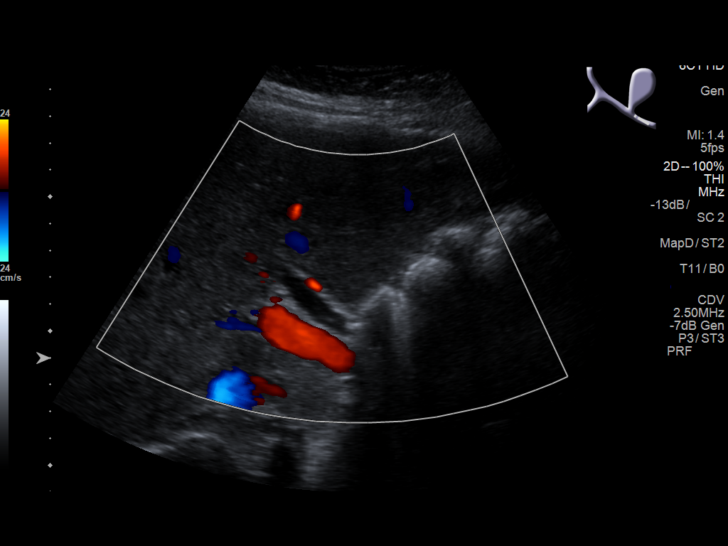
[im 37/60]
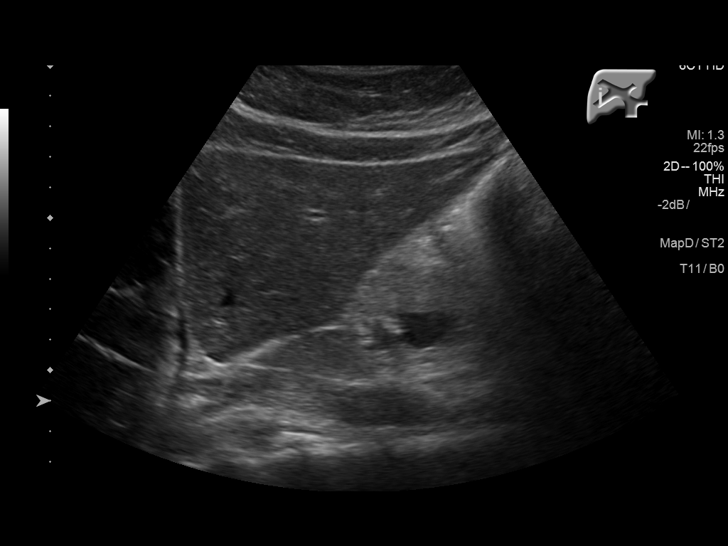
[im 40/60]
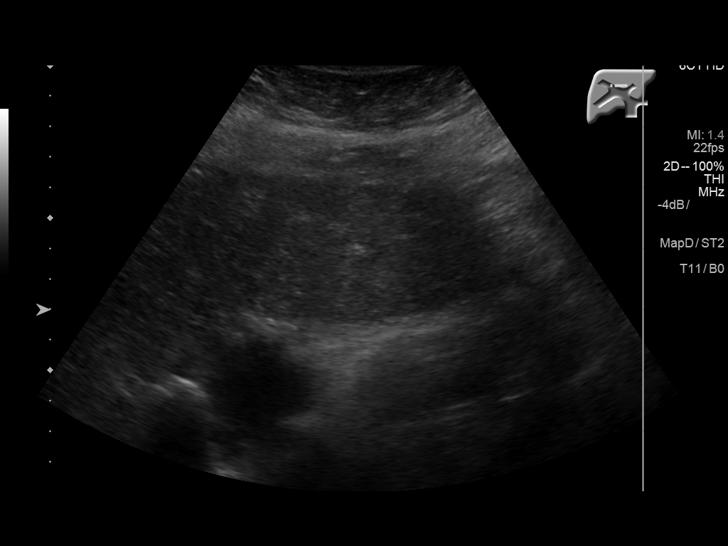
[im 45/60]
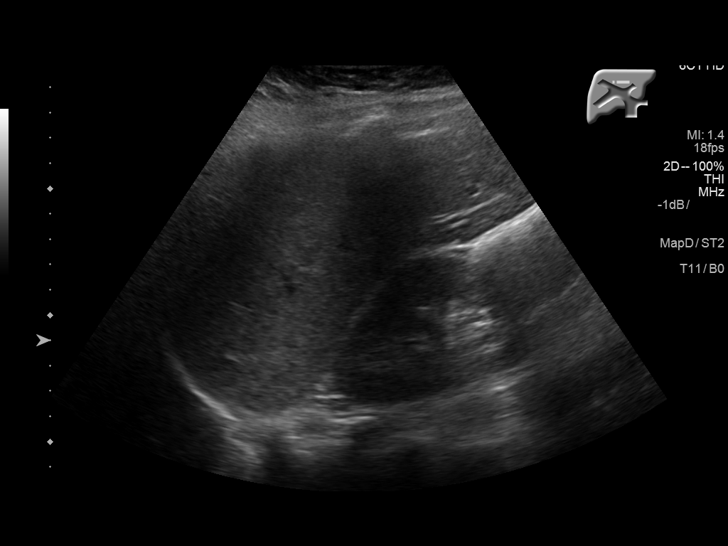
[im 50/60]
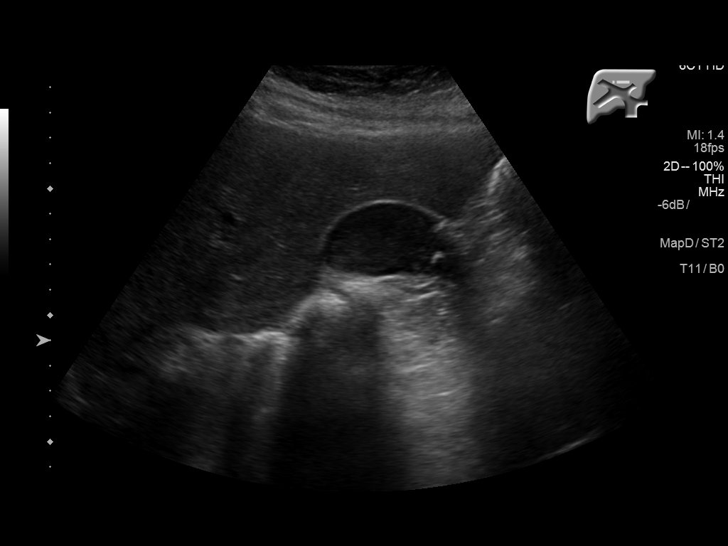
[im 55/60]
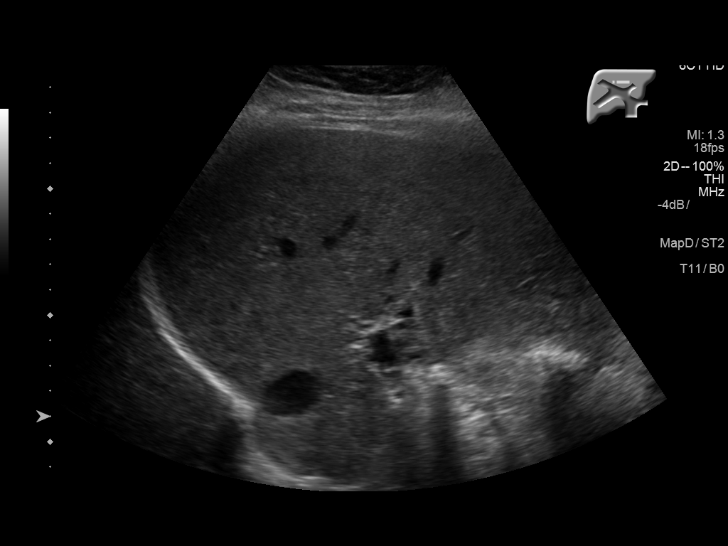
[im 60/60]
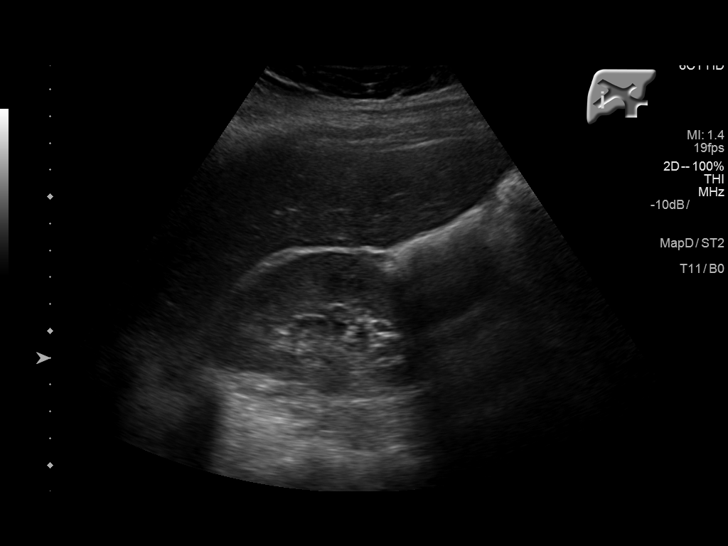

[14 of 25 positions shown; findings below may reference images not displayed]

FINDINGS: Gallbladder:

Re- demonstrated sludge and gallstones. There is no wall thickening
or focal tenderness suggestive of acute cholecystitis.

Common bile duct:

Diameter: 9.5 mm, increased from prior. The distal CBD is obscured
by bowel gas but there is suspicious echogenic shadowing structure
at the lower margin of the visible CBD. New intrahepatic bile duct
dilatation at the hilum.

Liver:

No focal lesion identified. Within normal limits in parenchymal
echogenicity.
IMPRESSION: 1. Progressed bile duct dilatation compared to 2 days ago. High
likelihood of choledocholithiasis at the distal CBD, but neighboring
artifact from bowel gas.
2. Cholelithiasis.
